# Patient Record
Sex: Male | Born: 1970 | Race: White | Hispanic: No | Marital: Married | Smoking: Former smoker
Health system: Southern US, Community
[De-identification: ages and names within clinical notes are randomized; demographics above are authoritative.]

---

## 2005-08-12 ENCOUNTER — Emergency Department: Payer: Self-pay | Admitting: Unknown Physician Specialty

## 2005-08-15 ENCOUNTER — Emergency Department: Payer: Self-pay | Admitting: Emergency Medicine

## 2005-08-19 ENCOUNTER — Emergency Department: Payer: Self-pay | Admitting: Internal Medicine

## 2005-08-26 ENCOUNTER — Emergency Department: Payer: Self-pay | Admitting: Emergency Medicine

## 2005-09-09 ENCOUNTER — Emergency Department: Payer: Self-pay | Admitting: Internal Medicine

## 2008-02-05 HISTORY — PX: APPENDECTOMY: SHX54

## 2008-11-22 ENCOUNTER — Ambulatory Visit: Payer: Self-pay | Admitting: Internal Medicine

## 2008-11-24 ENCOUNTER — Ambulatory Visit: Payer: Self-pay | Admitting: Surgery

## 2010-04-15 IMAGING — CT CT ABD-PELV W/ CM
1 of 2 series · 14 of 32 positions shown, 18 images · non-contrast
Comparison: none

REASON FOR EXAM: Disfused Abd Pain Increased White Blood Cells
COMMENTS:

[Series 2: soft tissue · axial · 0.79mm/px · z∈[-524,-88]mm · 14 of 95 slices shown, 18 images]
[im 4/95  soft-tissue]
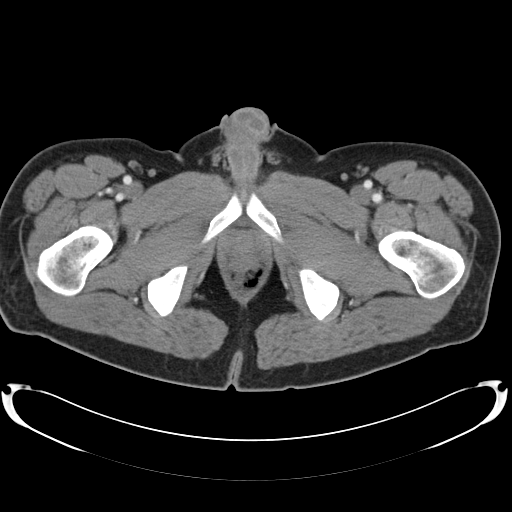
[im 4/95  bone]
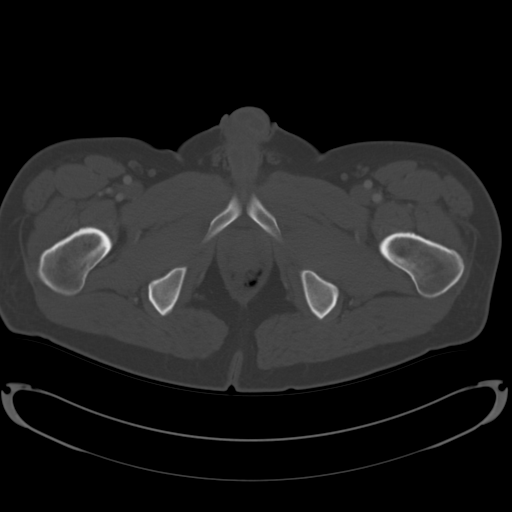
[im 12/95  soft-tissue]
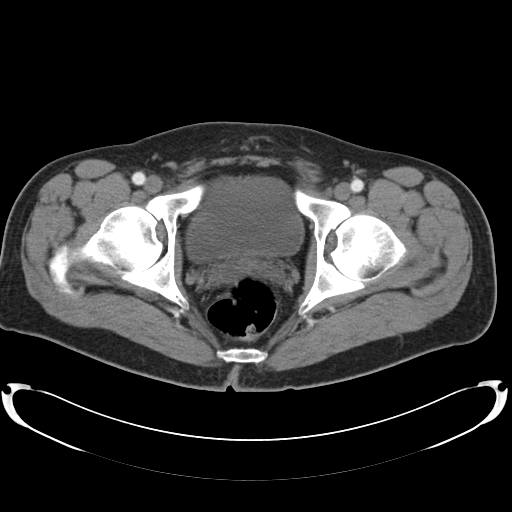
[im 20/95  soft-tissue]
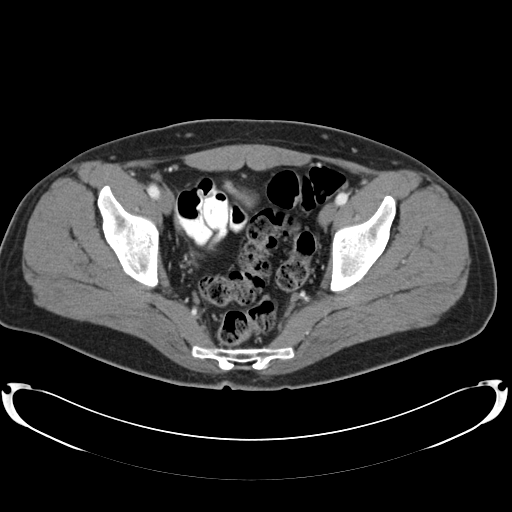
[im 28/95  soft-tissue]
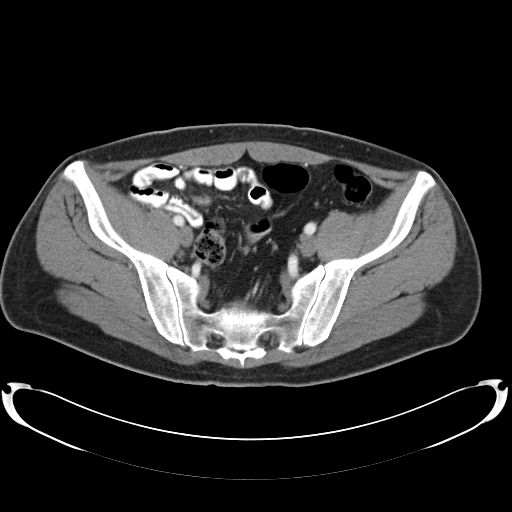
[im 36/95  soft-tissue]
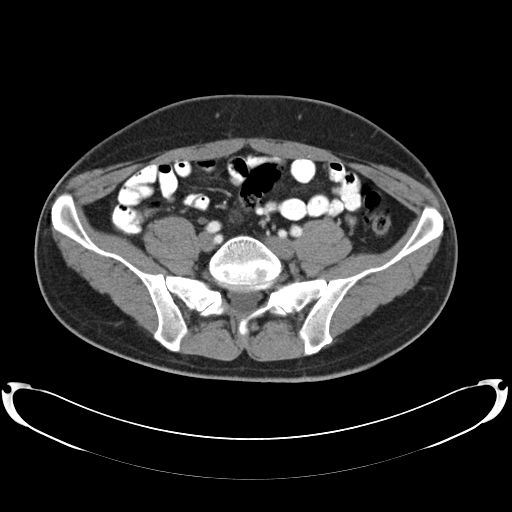
[im 44/95  soft-tissue]
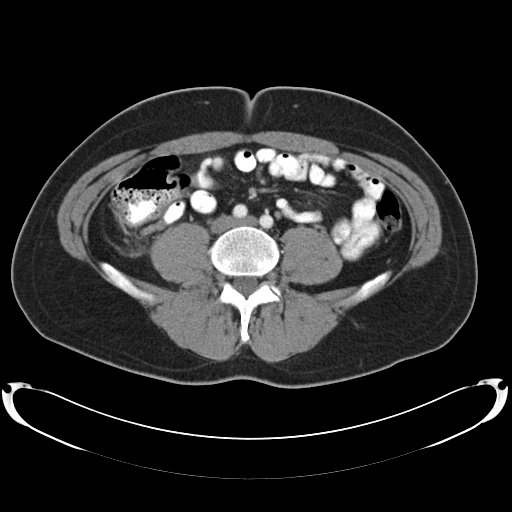
[im 51/95  soft-tissue]
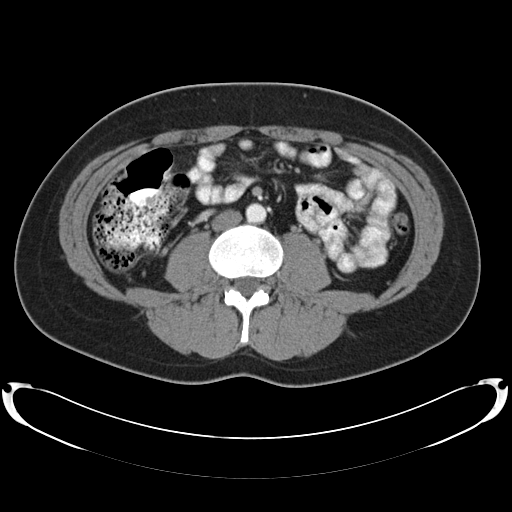
[im 59/95  soft-tissue]
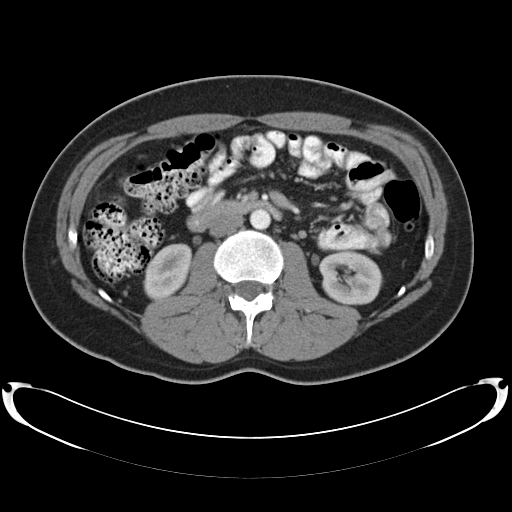
[im 67/95  soft-tissue]
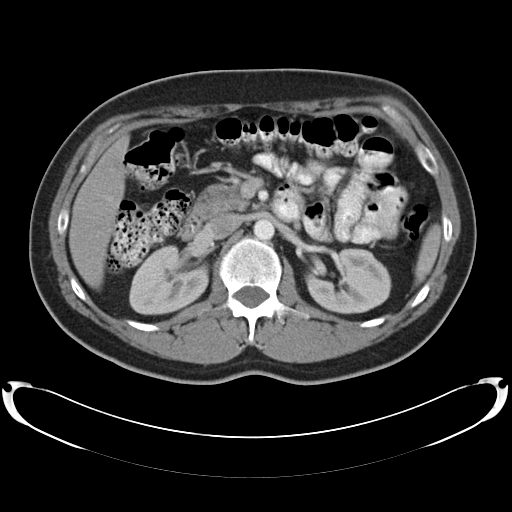
[im 67/95  bone]
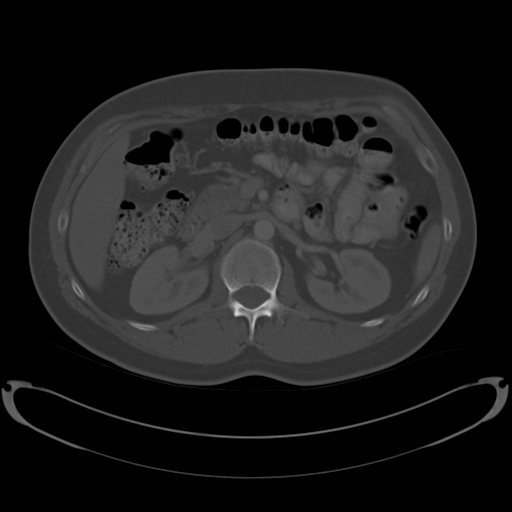
[im 75/95  soft-tissue]
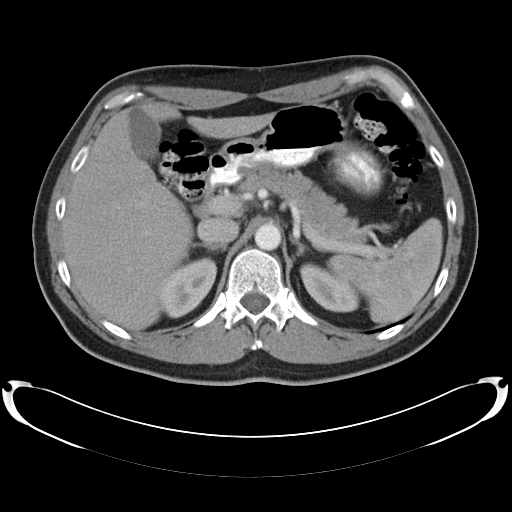
[im 79/95  lung]
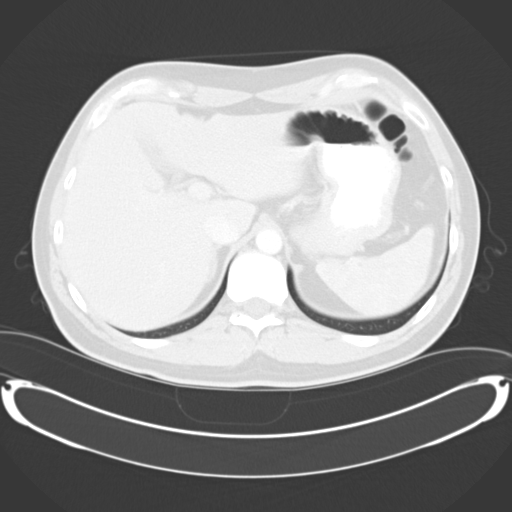
[im 83/95  soft-tissue]
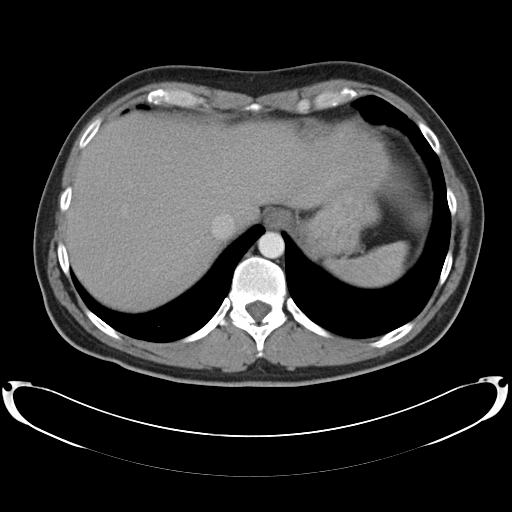
[im 83/95  lung]
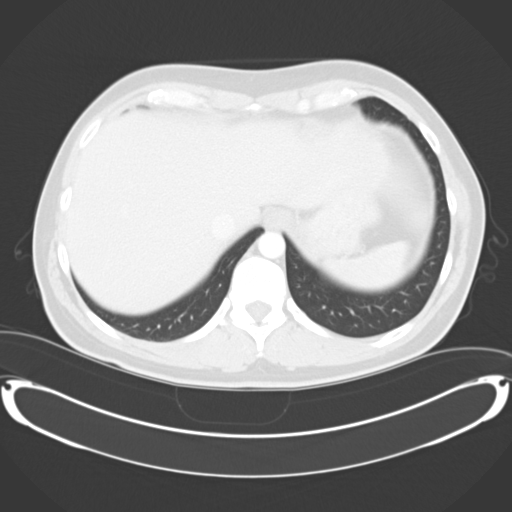
[im 87/95  lung]
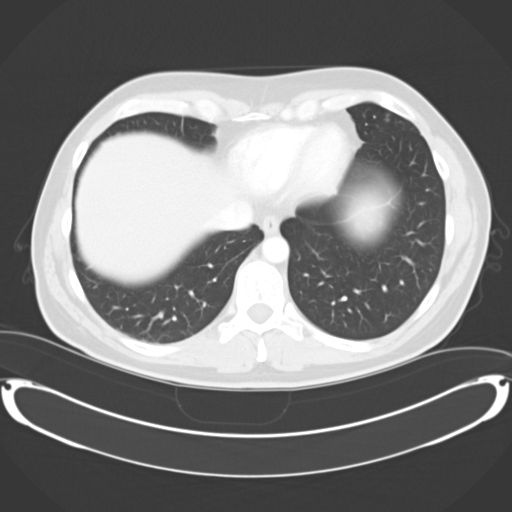
[im 91/95  soft-tissue]
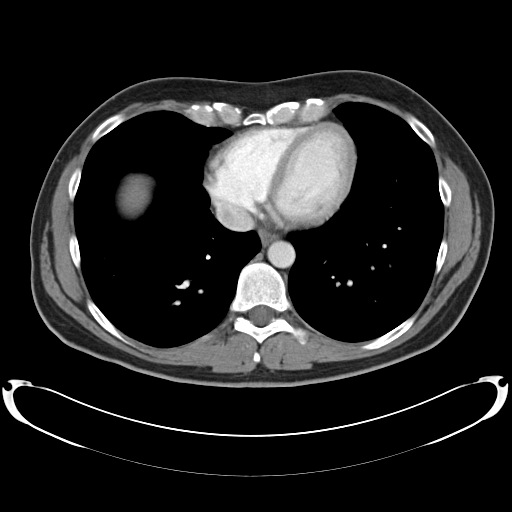
[im 91/95  lung]
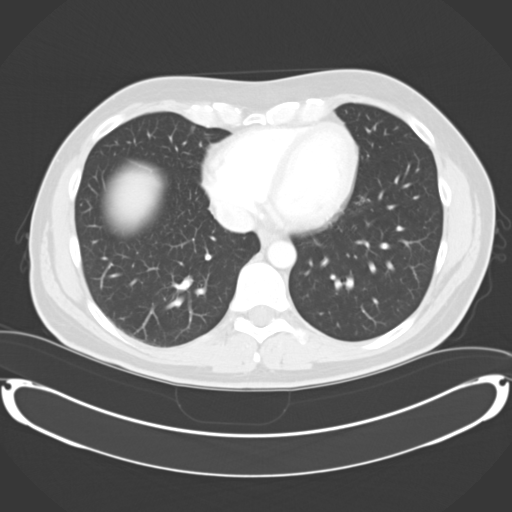

[14 of 32 positions shown; findings below may reference images not displayed]

PROCEDURE:     CT  - CT ABDOMEN / PELVIS  W  - November 22, 2008  [DATE]

RESULT:     Axial CT scanning was performed through the abdomen and pelvis
at 5 mm intervals and slice thicknesses following intravenous demonstration
of 100 cc of Isovue 370. The patient also received oral contrast material.
Review of 3-dimensional reconstructed images was performed separately on the
[employer] monitor.

The liver exhibits normal density with no focal mass nor ductal dilation.
The gallbladder is adequately distended with no evidence of calcified stones
or wall thickening or pericholecystic fluid. The pancreas, spleen, adrenal
glands, partially distended stomach, and kidneys exhibit no acute
abnormality. There is likely a subcapsular cyst measuring 1 cm in diameter
involving the posterior aspect of the spleen. The caliber of the abdominal
aorta is normal. The periaortic and pericaval regions are normal.

The partially contrast-filled loops of small and large bowel are normal in
appearance. There is mild inflammatory change in the right lower quadrant of
the abdomen associated with the appendix. It appears very mildly edematous
and there is increased density the. There is no abcess or free fluid or free
air nor bowel obstruction. There is no evidence of ascites. The prostate
gland produces a mild impression upon the urinary bladder base. The
ureterovesical junctions are demonstrated and appear normal. The seminal
vesicles and pelvic sidewalls exhibit no acute abnormality. The rectosigmoid
colon is within the limits of normal. The lung bases are clear. The lumbar
vertebral bodies are preserved in height.
IMPRESSION: 1. There are mild inflammatory changes in the periappendiceal region. The
appendix itself does not fill with contrast. No appendicolith is seen. I see
no discrete abscess however. The areas of abnormality are seen on images 50
through 58. Surgical consultation is recommended.
2. There is no evidence of urinary tract obstruction nor inflammatory change
of either kidney.
3. There is no evidence of gallstones nor other acute hepatobiliary
abnormality.

This report was called to Dr. Zeinab at approximately [DATE] p.m. on [DATE]

## 2010-04-17 IMAGING — US ABDOMEN ULTRASOUND LIMITED
1 series · 17 of 25 positions shown · non-contrast
Comparison: none

REASON FOR EXAM: [id number]  abd pain eval gallbladder
COMMENTS:

[Series 1: abdomen ultrasound limited · 17 of 57 slices shown]
[im 1/57]
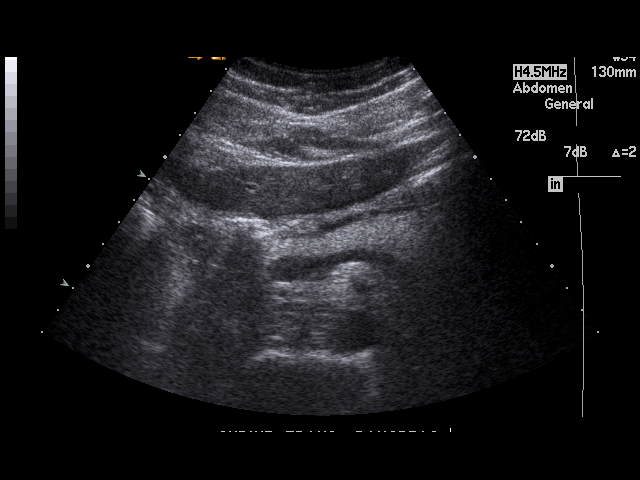
[im 5/57]
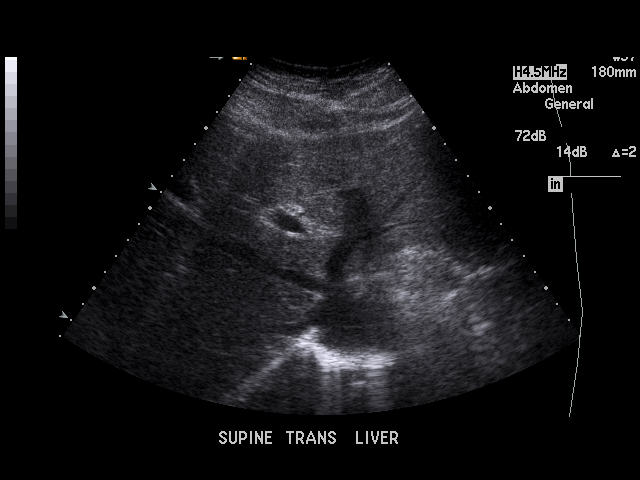
[im 8/57]
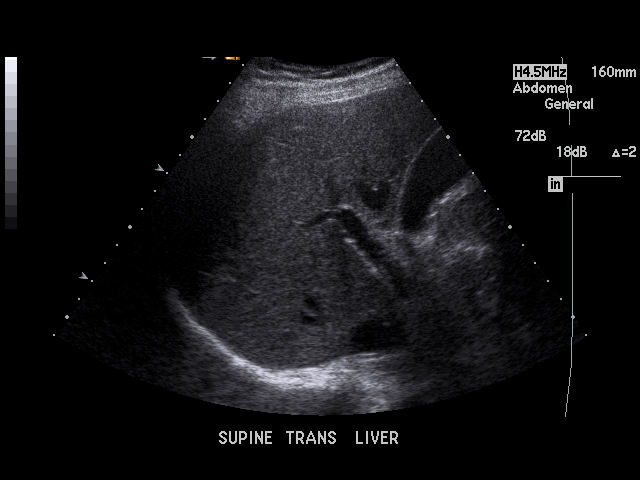
[im 12/57]
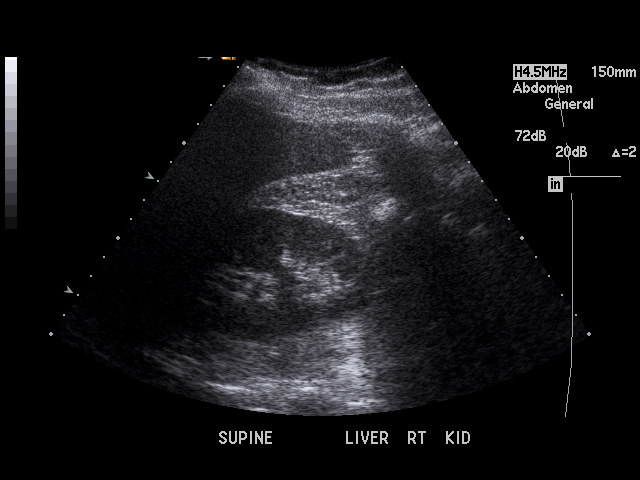
[im 15/57]
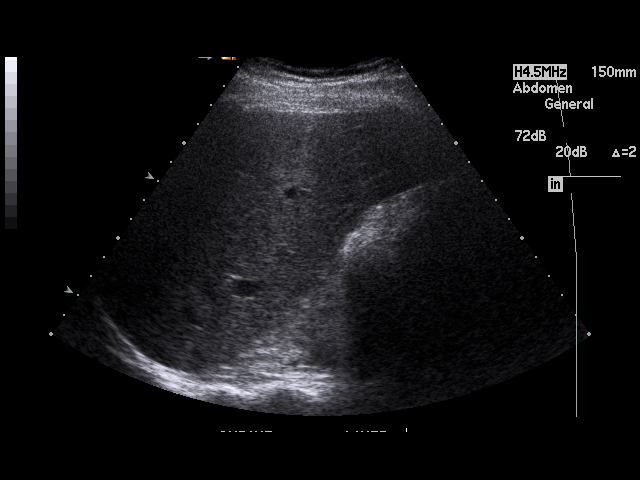
[im 19/57]
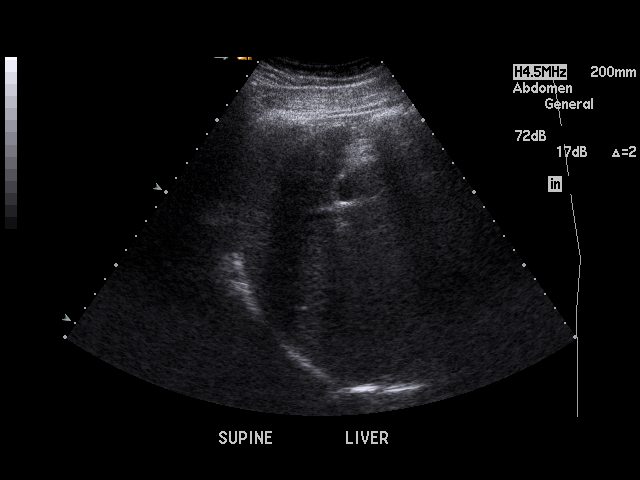
[im 22/57]
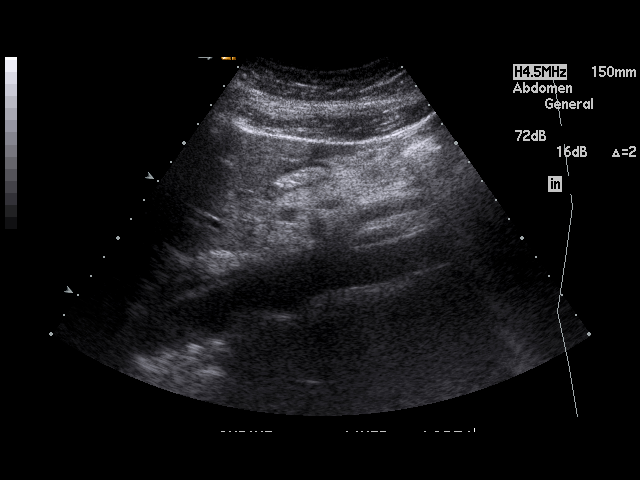
[im 26/57]
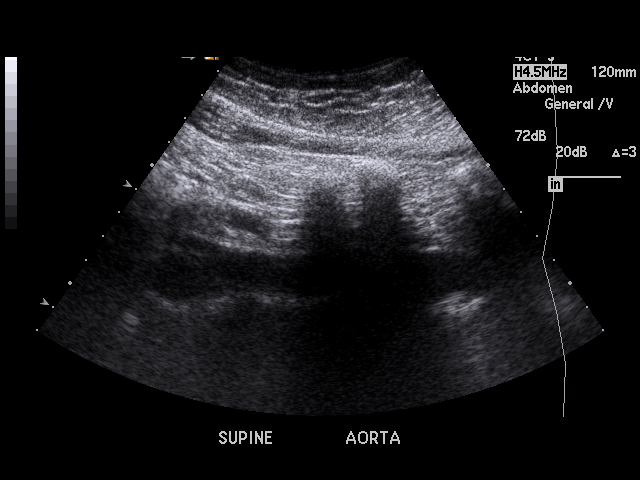
[im 29/57]
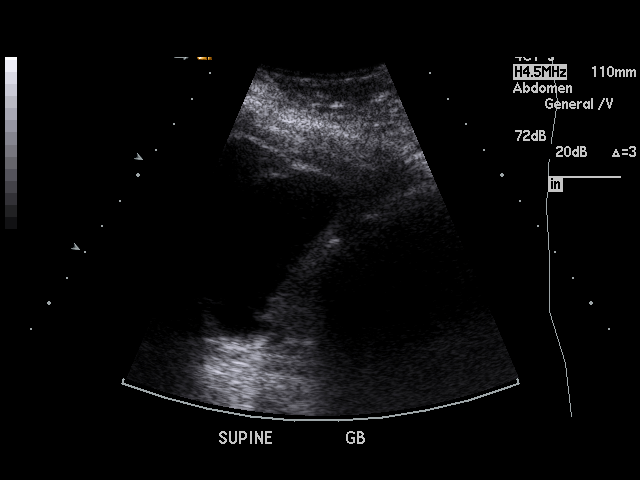
[im 31/57]
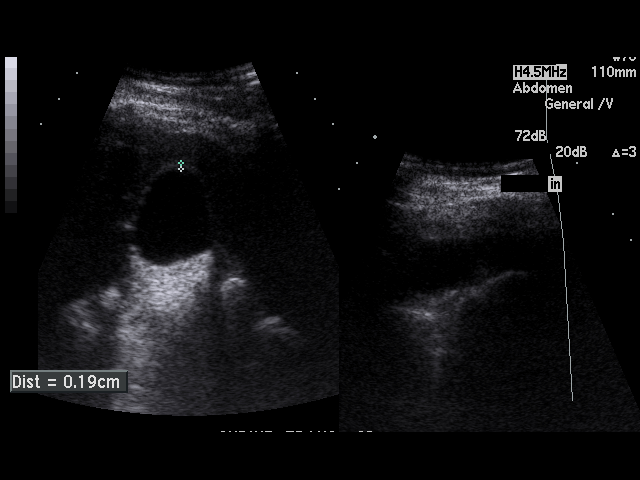
[im 36/57]
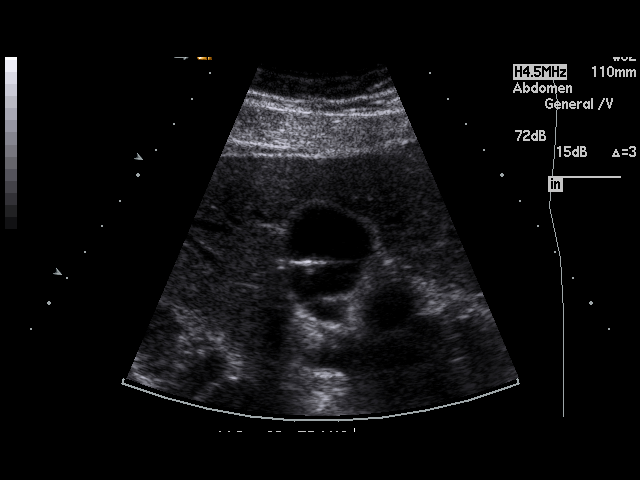
[im 38/57]
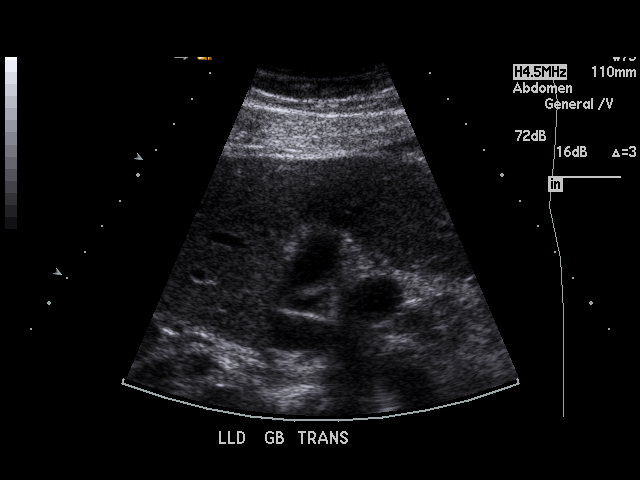
[im 43/57]
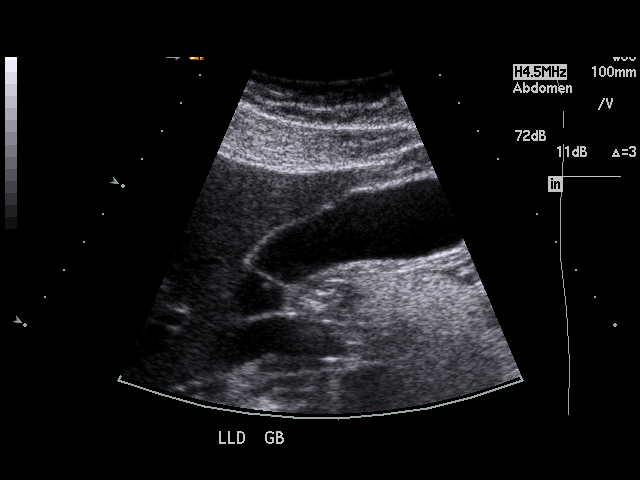
[im 45/57]
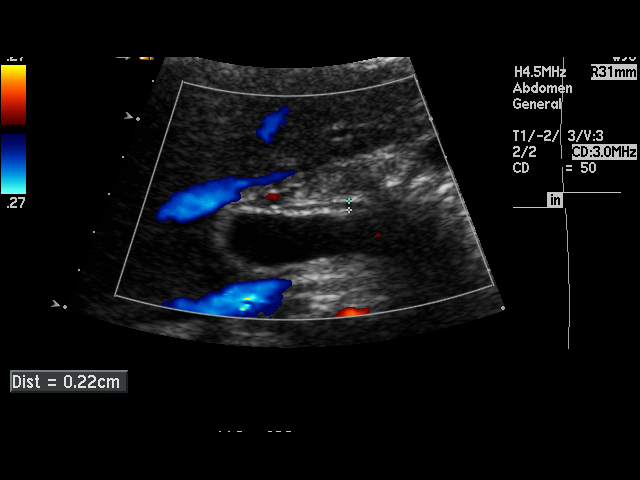
[im 50/57]
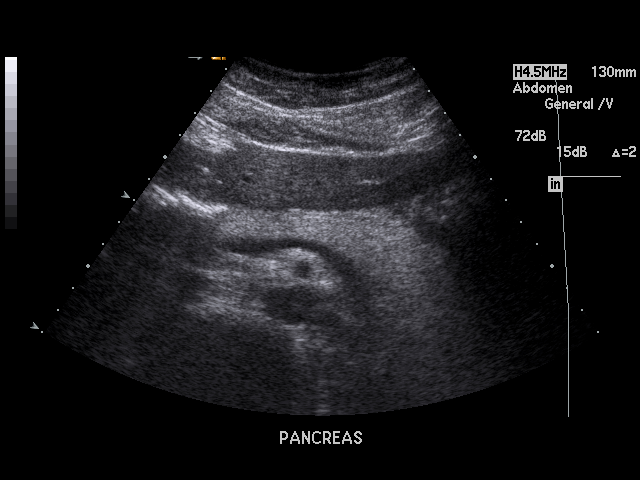
[im 52/57]
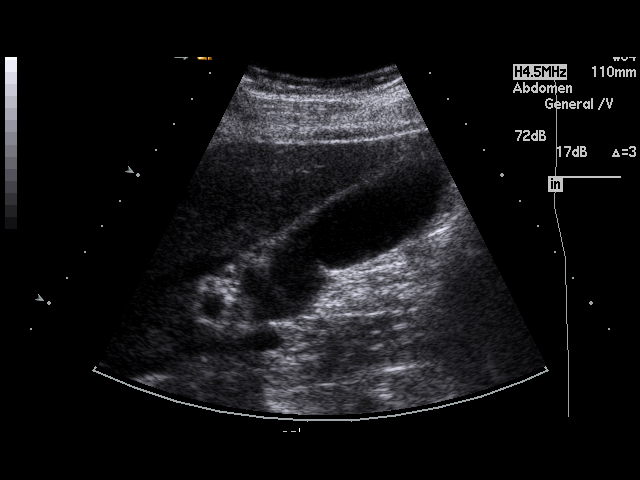
[im 57/57]
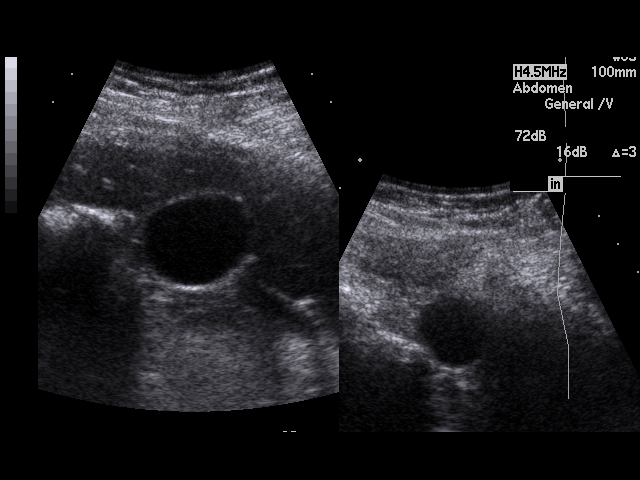

[17 of 25 positions shown; findings below may reference images not displayed]

PROCEDURE:     US  - US ABDOMEN LIMITED SURVEY  - November 24, 2008  [DATE]

RESULT:     Limited abdominal examination for evaluation of the gallbladder
was performed. No gallstones are seen. There is a tiny, nonmobile,
nonshadowing echodensity in the gallbladder consistent with a polyp that
measures 3.7 mm at maximum diameter. At least two other tiny echodensities
are seen which may represent additional polyps. No findings suspicious for a
stone are seen. There is no thickening of the gallbladder wall. The common
bile duct measures 2.2 mm in diameter which is within normal limits. The
kidneys show no hydronephrosis. Note is made that the liver and pancreas are
also visualized and are normal in appearance.
IMPRESSION: 1.  No gallstones are seen.
2.  There is observed a 3.7 mm echodensity in the gallbladder compatible
with a small polyp. There may be two additional smaller polyps but this is
not certain.
3.  The gallbladder wall measures 1.9 mm in thickness which is well within
normal limits.
4.  The common bile duct is normal in size.

## 2014-02-16 ENCOUNTER — Ambulatory Visit (INDEPENDENT_AMBULATORY_CARE_PROVIDER_SITE_OTHER): Payer: No Typology Code available for payment source | Admitting: Family Medicine

## 2014-02-16 ENCOUNTER — Other Ambulatory Visit (INDEPENDENT_AMBULATORY_CARE_PROVIDER_SITE_OTHER): Payer: No Typology Code available for payment source

## 2014-02-16 ENCOUNTER — Encounter: Payer: Self-pay | Admitting: Family Medicine

## 2014-02-16 VITALS — BP 108/70 | HR 72 | Ht 72.0 in | Wt 187.0 lb

## 2014-02-16 DIAGNOSIS — M25512 Pain in left shoulder: Secondary | ICD-10-CM

## 2014-02-16 DIAGNOSIS — M7542 Impingement syndrome of left shoulder: Secondary | ICD-10-CM

## 2014-02-16 DIAGNOSIS — M75102 Unspecified rotator cuff tear or rupture of left shoulder, not specified as traumatic: Secondary | ICD-10-CM

## 2014-02-16 NOTE — Patient Instructions (Signed)
Good to see you Ice 20 minutes 2 times daily. Usually after activity and before bed. Exercises 3 times a week.  Vitamin D 2000 IU daily Continue the turmeric  Pennsaid twice daily as needed See me again in 3 weeks.

## 2014-02-16 NOTE — Progress Notes (Signed)
Tawana ScaleZach Murphy D.O. [facility] [address] [place], [place] [address] Phone: [phone] Subjective:    I'm seeing this patient by the request  of:  Dr. Dorothey BasemanStrickland.  CC: Left shoulder pain  BJY:[id number]:Subjective Peter BumpersJeffrey B Murphy is a 44 y.o. male coming in with complaint of left shoulder pain. Patient states he has had this pain for approximate 9 months. Patient remembers doing pushups and having severe pain. Patient states since that time he unfortunately continues to have difficulty actually moving his arms certain directions and states that the pain can cause some weakness. Denies any significant radiation down the arm or any numbness. Patient states though that it seems to be localized to the shoulder and can wake him up at night especially if he lays on that side. Patient was the severity of pain a 7 out of 10. Patient is to avoid over-the-counter medications as much as possible. She is able to do daily activities without any significant difficulty.     Past medical history, social, surgical and family history all reviewed in electronic medical record.   Review of Systems: No headache, visual changes, nausea, vomiting, diarrhea, constipation, dizziness, abdominal pain, skin rash, fevers, chills, night sweats, weight loss, swollen lymph nodes, body aches, joint swelling, muscle aches, chest pain, shortness of breath, mood changes.   Objective Blood pressure 108/70, pulse 72, height 6' (1.829 m), weight 187 lb (84.823 kg), SpO2 97 %.  General: No apparent distress alert and oriented x3 mood and affect normal, dressed appropriately.  HEENT: Pupils equal, extraocular movements intact  Respiratory: Patient's speak in full sentences and does not appear short of breath  Cardiovascular: No lower extremity edema, non tender, no erythema  Skin: Warm dry intact with no signs of infection or rash on extremities or on axial skeleton.  Abdomen: Soft nontender  Neuro: Cranial nerves II through  XII are intact, neurovascularly intact in all extremities with 2+ DTRs and 2+ pulses.  Lymph: No lymphadenopathy of posterior or anterior cervical chain or axillae bilaterally.  Gait normal with good balance and coordination.  MSK:  Non tender with full range of motion and good stability and symmetric strength and tone of  elbows, wrist, hip, knee and ankles bilaterally.  Shoulder: left Inspection reveals no abnormalities, atrophy or asymmetry. Palpation is normal with no tenderness over AC joint or bicipital groove. ROM is full in all planes passively. Rotator cuff strength normal throughout. signs of impingement with positive Neer and Hawkin's tests, but negative empty can sign. Speeds and Yergason's tests normal. No labral pathology noted with negative Obrien's, negative clunk and good stability. Normal scapular function observed. No painful arc and no drop arm sign. No apprehension sign Contralateral shoulder unremarkable  MSK US performed of: left This study was ordered, performed, and interpreted by Terrilee FilesZach Murphy D.O.  Shoulder:   Supraspinatus:  Mild chronic degenerative changes with question will healing articular surface tear of approximately 30%. Bursal bulge seen with shoulder abduction on impingement view. Infraspinatus:  Appears normal on long and transverse views. Significant increase in Doppler flow Subscapularis:  Mild degenerative changes. Positive bursa Teres Minor:  Appears normal on long and transverse views. AC joint:  Mild to moderate arthritis Glenohumeral Joint:  Appears normal without effusion. Glenoid Labrum:  Intact without visualized tears. Biceps Tendon:  Appears normal on long and transverse views, no fraying of tendon, tendon located in intertubercular groove, no subluxation with shoulder internal or external rotation.  Impression: Subacromial bursitis with some chronic changes of  the rotator cuff  Procedure: Real-time Ultrasound Guided Injection of left  glenohumeral joint Device: GE Logiq E  Ultrasound guided injection is preferred based studies that show increased duration, increased effect, greater accuracy, decreased procedural pain, increased response rate with ultrasound guided versus blind injection.  Verbal informed consent obtained.  Time-out conducted.  Noted no overlying erythema, induration, or other signs of local infection.  Skin prepped in a sterile fashion.  Local anesthesia: Topical Ethyl chloride.  With sterile technique and under real time ultrasound guidance:  Joint visualized.  23g 1  inch needle inserted posterior approach. Pictures taken for needle placement. Patient did have injection of 2 cc of 1% lidocaine, 2 cc of 0.5% Marcaine, and 1.0 cc of Kenalog 40 mg/dL. Completed without difficulty  Pain immediately resolved suggesting accurate placement of the medication.  Advised to call if fevers/chills, erythema, induration, drainage, or persistent bleeding.  Images permanently stored and available for review in the ultrasound unit.  Impression: Technically successful ultrasound guided injection.     Impression and Recommendations:     This case required medical decision making of moderate complexity.

## 2014-02-16 NOTE — Assessment & Plan Note (Addendum)
Patient was given an injection today. Patient tolerated the procedure well with good resolution of pain. We discussed home exercises and icing, call. We discussed over-the-counter medications a can be beneficial in patient will try some topical anti-inflammatories. Patient and will come back again in 3 weeks for further evaluation. Patient has any increasing weakness she will come back sooner in advance imaging would likely be warranted. I believe the patient will do very well with conservative therapy.

## 2014-03-09 ENCOUNTER — Encounter: Payer: Self-pay | Admitting: Family Medicine

## 2014-03-09 ENCOUNTER — Ambulatory Visit (INDEPENDENT_AMBULATORY_CARE_PROVIDER_SITE_OTHER): Payer: No Typology Code available for payment source | Admitting: Family Medicine

## 2014-03-09 VITALS — BP 128/78 | HR 68 | Ht 72.0 in | Wt 178.0 lb

## 2014-03-09 DIAGNOSIS — M129 Arthropathy, unspecified: Secondary | ICD-10-CM

## 2014-03-09 DIAGNOSIS — M7542 Impingement syndrome of left shoulder: Secondary | ICD-10-CM

## 2014-03-09 DIAGNOSIS — M19019 Primary osteoarthritis, unspecified shoulder: Secondary | ICD-10-CM

## 2014-03-09 DIAGNOSIS — M75102 Unspecified rotator cuff tear or rupture of left shoulder, not specified as traumatic: Secondary | ICD-10-CM

## 2014-03-09 NOTE — Progress Notes (Signed)
Pre visit review using our clinic review tool, if applicable. No additional management support is needed unless otherwise documented below in the visit note. 

## 2014-03-09 NOTE — Assessment & Plan Note (Signed)
She was given phase II exercises. We discussed icing regimen at home exercises. We discussed what activities in the gym to avoid. Patient was given an exercise prescription today. Patient will try to make these different changes and come back in 3 weeks. Patient continues to have difficulty we'll consider an acromioclavicular joint injection.  Spent  25 minutes with patient face-to-face and had greater than 50% of counseling including as described above in assessment and plan.

## 2014-03-09 NOTE — Patient Instructions (Signed)
Good to see you Peter Murphy is your friend.  Would continue the exercises still 3 times a week for another 6 weeks.  OK to go to gym but decrease 50% and increase 10-15% a week.  Pennsaid as needed See me again in 3 weeks if still in pain we can try a AC injection.

## 2014-03-09 NOTE — Progress Notes (Signed)
  Tawana ScaleZach Payden Murphy D.O. [facility] [address] [place], [place] [address] Phone: [phone]([phone] Subjective:    CC: Left shoulder pain  HQI:[id number]:Subjective Peter BumpersJeffrey B Murphy is a 44 y.o. male coming in with complaint of left shoulder pain. Patient initially had an injury after doing pushups. Patient was found to have subacromial bursitis as well as some degenerative changes of the rotator cuff. Patient was given an injection, home exercises and we discussed over-the-counter medications. Patient states he is approximately 90% better. Patient still has some mild discomfort with certain motions but overall is feeling much better. Patient states that he has not been back to the gym but has been doing significant amount of the exercises. Patient continues with cardio. Patient states that he is taken the vitamins and this is been helpful as well. Denies any nighttime awakening.     Past medical history, social, surgical and family history all reviewed in electronic medical record.   Review of Systems: No headache, visual changes, nausea, vomiting, diarrhea, constipation, dizziness, abdominal pain, skin rash, fevers, chills, night sweats, weight loss, swollen lymph nodes, body aches, joint swelling, muscle aches, chest pain, shortness of breath, mood changes.   Objective Blood pressure 128/78, pulse 68, height 6' (1.829 m), weight 178 lb (80.74 kg), SpO2 98 %.  General: No apparent distress alert and oriented x3 mood and affect normal, dressed appropriately.  HEENT: Pupils equal, extraocular movements intact  Respiratory: Patient's speak in full sentences and does not appear short of breath  Cardiovascular: No lower extremity edema, non tender, no erythema  Skin: Warm dry intact with no signs of infection or rash on extremities or on axial skeleton.  Abdomen: Soft nontender  Neuro: Cranial nerves II through XII are intact, neurovascularly intact in all extremities with 2+ DTRs and 2+ pulses.  Lymph:  No lymphadenopathy of posterior or anterior cervical chain or axillae bilaterally.  Gait normal with good balance and coordination.  MSK:  Non tender with full range of motion and good stability and symmetric strength and tone of  elbows, wrist, hip, knee and ankles bilaterally.  Shoulder: left Inspection reveals no abnormalities, atrophy or asymmetry. Mild tenderness over the acromial clavicular joint ROM is full in all planes passively. Rotator cuff strength normal throughout. Mild impingement sign still left Speeds and Yergason's tests normal. No labral pathology noted with negative Obrien's, negative clunk and good stability. Normal scapular function observed. No painful arc and no drop arm sign. No apprehension sign Contralateral shoulder unremarkable      Impression and Recommendations:     This case required medical decision making of moderate complexity.

## 2014-03-31 ENCOUNTER — Ambulatory Visit (INDEPENDENT_AMBULATORY_CARE_PROVIDER_SITE_OTHER): Payer: No Typology Code available for payment source | Admitting: Family Medicine

## 2014-03-31 ENCOUNTER — Encounter: Payer: Self-pay | Admitting: Family Medicine

## 2014-03-31 VITALS — BP 120/74 | HR 67 | Ht 72.0 in | Wt 180.0 lb

## 2014-03-31 DIAGNOSIS — M7522 Bicipital tendinitis, left shoulder: Secondary | ICD-10-CM | POA: Insufficient documentation

## 2014-03-31 MED ORDER — NITROGLYCERIN 0.2 MG/HR TD PT24: MEDICATED_PATCH | TRANSDERMAL | Status: DC

## 2014-03-31 NOTE — Progress Notes (Signed)
Tawana ScaleZach Keysean Savino D.O. [facility] [address] [place], [place] [address] Phone: [phone]([phone] Subjective:    CC: Left shoulder pain  GUY:[id number]:Subjective Peter BumpersJeffrey B Murphy is a 44 y.o. male coming in with complaint of left shoulder pain. Patient initially had an injury after doing pushups. Patient was found to have subacromial bursitis as well as some degenerative changes of the rotator cuff. Patient was given an injection, home exercises and we discussed over-the-counter medications. At last follow-up patient was doing a proximal wing 90% better and elected to continue with the conservative therapy. 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 Patient was to try topical anti-inflammatories. Patient states he continues to do well. Patient though states that when he reaches across his body or holds different things is still can be uncomfortable. Patient is also noticed that when he is doing bicep curls he has some mild discomfort. This is still significantly better than what it was previously but seems to be more on the anterior aspect of his shoulder. Denies any pain that is keeping him up at night.     Past medical history, social, surgical and family history all reviewed in electronic medical record.   Review of Systems: No headache, visual changes, nausea, vomiting, diarrhea, constipation, dizziness, abdominal pain, skin rash, fevers, chills, night sweats, weight loss, swollen lymph nodes, body aches, joint swelling, muscle aches, chest pain, shortness of breath, mood changes.   Objective Blood pressure 120/74, pulse 67, height 6' (1.829 m), weight 180 lb (81.647 kg), SpO2 98 %.  General: No apparent distress alert and oriented x3 mood and affect normal, dressed appropriately.  HEENT: Pupils equal, extraocular movements intact  Respiratory: Patient's speak in full sentences and does not appear short of breath  Cardiovascular: No lower extremity edema, non  tender, no erythema  Skin: Warm dry intact with no signs of infection or rash on extremities or on axial skeleton.  Abdomen: Soft nontender  Neuro: Cranial nerves II through XII are intact, neurovascularly intact in all extremities with 2+ DTRs and 2+ pulses.  Lymph: No lymphadenopathy of posterior or anterior cervical chain or axillae bilaterally.  Gait normal with good balance and coordination.  MSK:  Non tender with full range of motion and good stability and symmetric strength and tone of  elbows, wrist, hip, knee and ankles bilaterally.  Shoulder: left Inspection reveals no abnormalities, atrophy or asymmetry. Mild tenderness over the acromial clavicular joint ROM is full in all planes passively. Rotator cuff strength normal throughout. Mild impingement sign still left the continues to improve Speeds and Yergason's tests positive which is a new problem No labral pathology noted with negative Obrien's, negative clunk and good stability. Normal scapular function observed. No painful arc and no drop arm sign. No apprehension sign Contralateral shoulder unremarkable  MSK US performed of: Left shoulder This study was ordered, performed, and interpreted by Terrilee FilesZach Cambri Plourde D.O.  Shoulder:   Supraspinatus:  Appears normal on long and transverse views, minimal bursal bulge still noted. Infraspinatus:  Appears normal on long and transverse views. Subscapularis:  Appears normal on long and transverse views. Teres Minor:  Appears normal on long and transverse views. AC joint:  Moderate osteophytic changes with capsular distention Glenohumeral Joint:  Appears normal without effusion. Glenoid Labrum:  Intact without visualized tears. Biceps Tendon:  Bicep tendon does have some mild increase in tendon sheath effusion noted  Impression: Improved subacromial bursitis with continued moderate acromioclavicular arthritis and new onset bicep tendinitis.  Impression and Recommendations:      This case required medical decision making of moderate complexity.

## 2014-03-31 NOTE — Patient Instructions (Addendum)
Good to see you Ice still can be helpful.  New exercises 3 times a week.  Compression sleeve to left arm from [employer].  Continue the vitamins Nitroglycerin Protocol   Apply 1/4 nitroglycerin patch to affected area daily.  Change position of patch within the affected area every 24 hours.  You may experience a headache during the first 1-2 weeks of using the patch, these should subside.  If you experience headaches after beginning nitroglycerin patch treatment, you may take your preferred over the counter pain reliever.  Another side effect of the nitroglycerin patch is skin irritation or rash related to patch adhesive.  Please notify our office if you develop more severe headaches or rash, and stop the patch.  Tendon healing with nitroglycerin patch may require 12 to 24 weeks depending on the extent of injury.  Men should not use if taking Viagra, Cialis, or Levitra.   Do not use if you have migraines or rosacea.   See me again 3 weeks.

## 2014-03-31 NOTE — Assessment & Plan Note (Signed)
Is having more of a bicep tendinitis. We discussed continuing his regimen but given new exercises. Patient will continue the icing. We'll certainly start patient on nitroglycerin patches to see if we can aid in healing. Patient was warned of potential side effects. We discussed avoiding other significant activities and patient will come back and see me again in 3 weeks for further evaluation and treatment.  Spent  25 minutes with patient face-to-face and had greater than 50% of counseling including as described above in assessment and plan.

## 2014-03-31 NOTE — Progress Notes (Signed)
Pre visit review using our clinic review tool, if applicable. No additional management support is needed unless otherwise documented below in the visit note. 

## 2014-04-21 ENCOUNTER — Ambulatory Visit: Payer: No Typology Code available for payment source | Admitting: Family Medicine

## 2014-04-26 ENCOUNTER — Encounter: Payer: Self-pay | Admitting: Family Medicine

## 2014-04-26 ENCOUNTER — Ambulatory Visit (INDEPENDENT_AMBULATORY_CARE_PROVIDER_SITE_OTHER): Payer: No Typology Code available for payment source | Admitting: Family Medicine

## 2014-04-26 VITALS — BP 116/80 | HR 62 | Ht 72.0 in | Wt 180.0 lb

## 2014-04-26 DIAGNOSIS — M7522 Bicipital tendinitis, left shoulder: Secondary | ICD-10-CM

## 2014-04-26 NOTE — Progress Notes (Signed)
  Tawana ScaleZach Sahira Cataldi D.O. [facility] [address] [place], [place] [address] Phone: [phone] Subjective:    CC: Left shoulder pain follow-up  OZH:[id number]:Subjective Peter BumpersJeffrey Murphy Murphy is a 44 y.o. male coming in with complaint of left shoulder pain. Patient was seen previously and was diagnosed with more of a biceps tendinitis. Patient is doing a compression sleeve and was started on nitroglycerin. Patient did have some headaches for the first week and then they seemed to resolve. Patient states that he is approximate 75% better. Still a dull aching pain if he does a lot of activity but otherwise seems to be improving. Denies any radiation down the arm or any numbness. Still not lifting. Patient though is resting comfortably at night.    Past medical history, social, surgical and family history all reviewed in electronic medical record.   Review of Systems: No headache, visual changes, nausea, vomiting, diarrhea, constipation, dizziness, abdominal pain, skin rash, fevers, chills, night sweats, weight loss, swollen lymph nodes, body aches, joint swelling, muscle aches, chest pain, shortness of breath, mood changes.   Objective Blood pressure 116/80, pulse 62, height 6' (1.829 m), weight 180 lb (81.647 kg), SpO2 98 %.  General: No apparent distress alert and oriented x3 mood and affect normal, dressed appropriately.  HEENT: Pupils equal, extraocular movements intact  Respiratory: Patient's speak in full sentences and does not appear short of breath  Cardiovascular: No lower extremity edema, non tender, no erythema  Skin: Warm dry intact with no signs of infection or rash on extremities or on axial skeleton.  Abdomen: Soft nontender  Neuro: Cranial nerves II through XII are intact, neurovascularly intact in all extremities with 2+ DTRs and 2+ pulses.  Lymph: No lymphadenopathy of posterior or anterior cervical chain or axillae bilaterally.  Gait normal with good balance and coordination.  MSK:   Non tender with full range of motion and good stability and symmetric strength and tone of  elbows, wrist, hip, knee and ankles bilaterally.  Shoulder: left Inspection reveals no abnormalities, atrophy or asymmetry. Mild tenderness over the acromial clavicular joint ROM is full in all planes passively. Rotator cuff strength normal throughout. Mild impingement sign still left the continues to improve Speeds and Yergason's tests positive still No labral pathology noted with negative Obrien's, negative clunk and good stability. Normal scapular function observed. No painful arc and no drop arm sign. No apprehension sign Contralateral shoulder unremarkable  MSK US performed of: Left shoulder This study was ordered, performed, and interpreted by Terrilee FilesZach Jasdeep Dejarnett D.O.  Shoulder:   Supraspinatus:  Appears normal on long and transverse views, minimal bursal bulge still noted. Infraspinatus:  Appears normal on long and transverse views. Subscapularis:  Appears normal on long and transverse views. Teres Minor:  Appears normal on long and transverse views. AC joint:  Moderate osteophytic changes with capsular distention Biceps Tendon:  Bicep tendon does have some mild increase in tendon sheath effusion but moderately improved from previous exam  Impression: Improving bicep tendinitis       Impression and Recommendations:     This case required medical decision making of moderate complexity.

## 2014-04-26 NOTE — Assessment & Plan Note (Signed)
Patient is making improvement. We encouraged patient to continue the home exercises in the icing pedicle. We encouraged a compression sleeve as well is a nitroglycerin patches. Patient will continue this conservative therapy and follow-up with me again in 4 weeks. If continuing have pain at that time I would consider a tendon sheath injection.

## 2014-04-26 NOTE — Progress Notes (Signed)
Pre visit review using our clinic review tool, if applicable. No additional management support is needed unless otherwise documented below in the visit note. 

## 2014-04-26 NOTE — Patient Instructions (Signed)
Good to see you You are doing great!! Continue the nitro Try to do exercises at least 3 times a week.  Ice after activity See me again in 4 weeks and hopefully perfect.

## 2014-05-24 ENCOUNTER — Encounter: Payer: Self-pay | Admitting: Family Medicine

## 2014-05-24 ENCOUNTER — Ambulatory Visit (INDEPENDENT_AMBULATORY_CARE_PROVIDER_SITE_OTHER): Payer: No Typology Code available for payment source | Admitting: Family Medicine

## 2014-05-24 VITALS — BP 106/72 | HR 73 | Ht 72.0 in | Wt 181.0 lb

## 2014-05-24 DIAGNOSIS — M7522 Bicipital tendinitis, left shoulder: Secondary | ICD-10-CM | POA: Diagnosis not present

## 2014-05-24 NOTE — Progress Notes (Signed)
Pre visit review using our clinic review tool, if applicable. No additional management support is needed unless otherwise documented below in the visit note. 

## 2014-05-24 NOTE — Progress Notes (Signed)
  Tawana ScaleZach Srinidhi Landers D.O. [facility] [address] [place], [place] [address] Phone: [phone] Subjective:    CC: Left shoulder pain follow-up  BJY:[id number]:Subjective Peter Murphy is a 44 y.o. male coming in with complaint of left shoulder pain. Patient states he is a proximal wing 90% better from his bicep tendinitis. Continuing the nitroglycerin patches without any side effects. Patient has not been doing the exercises regularly. Is doing the icing and the compression is feeling much better. Denies any nighttime awakening.    Past medical history, social, surgical and family history all reviewed in electronic medical record.   Review of Systems: No headache, visual changes, nausea, vomiting, diarrhea, constipation, dizziness, abdominal pain, skin rash, fevers, chills, night sweats, weight loss, swollen lymph nodes, body aches, joint swelling, muscle aches, chest pain, shortness of breath, mood changes.   Objective Blood pressure 106/72, pulse 73, height 6' (1.829 m), weight 181 lb (82.101 kg), SpO2 97 %.  General: No apparent distress alert and oriented x3 mood and affect normal, dressed appropriately.  HEENT: Pupils equal, extraocular movements intact  Respiratory: Patient's speak in full sentences and does not appear short of breath  Cardiovascular: No lower extremity edema, non tender, no erythema  Skin: Warm dry intact with no signs of infection or rash on extremities or on axial skeleton.  Abdomen: Soft nontender  Neuro: Cranial nerves II through XII are intact, neurovascularly intact in all extremities with 2+ DTRs and 2+ pulses.  Lymph: No lymphadenopathy of posterior or anterior cervical chain or axillae bilaterally.  Gait normal with good balance and coordination.  MSK:  Non tender with full range of motion and good stability and symmetric strength and tone of  elbows, wrist, hip, knee and ankles bilaterally.  Shoulder: left Inspection reveals no abnormalities, atrophy or  asymmetry. Mild tenderness over the acromial clavicular joint ROM is full in all planes passively. Rotator cuff strength normal throughout. Mild impingement sign still left the continues to improve unit from last exam Mild positive speed's No labral pathology noted with negative Obrien's, negative clunk and good stability. Normal scapular function observed. No painful arc and no drop arm sign. No apprehension sign Contralateral shoulder unremarkable  MSK US performed of: Left shoulder This study was ordered, performed, and interpreted by Terrilee FilesZach Keslee Harrington D.O.  Shoulder:   Supraspinatus:  Appears normal on long and transverse views, minimal bursal bulge still noted. Infraspinatus:  Appears normal on long and transverse views. Subscapularis:  Appears normal on long and transverse views. Teres Minor:  Appears normal on long and transverse views. AC joint:  Moderate osteophytic changes with capsular distention Biceps Tendon:  Bicep tendon has no effusion noted and no tearing appreciated. Increasing Doppler flow is noted still.  Impression: Nearly resolved biceps tendinitis       Impression and Recommendations:     This case required medical decision making of moderate complexity.

## 2014-05-24 NOTE — Assessment & Plan Note (Signed)
She is doing significantly better at this time. We discussed continuing the nitroglycerin for another 3 weeks and then every other day for 3 weeks. We discussed icing regimen and is as well as decreasing the amount of compression. We discussed phase II exercises and strengthening exercises as well as getting patient to start doing more regular lifting. Patient and will come back and see me again in 6 weeks for further evaluation and treatment.

## 2014-05-24 NOTE — Patient Instructions (Signed)
Good to see you Wear compression only with increase activity or lifting Start exercises again in 1-2 weeks and do them 2 times a week OK to lift soup cans and increase slowly Ice after a lot of activity Nitro daily for 3 weeks then every other day for 3 weeks.  See me again in 6 weeks.

## 2014-07-07 ENCOUNTER — Ambulatory Visit: Payer: No Typology Code available for payment source | Admitting: Family Medicine

## 2014-07-21 ENCOUNTER — Ambulatory Visit: Payer: No Typology Code available for payment source | Admitting: Family Medicine

## 2014-10-17 ENCOUNTER — Ambulatory Visit
Admission: RE | Admit: 2014-10-17 | Discharge: 2014-10-17 | Disposition: A | Discharge status: Home / Self Care | Payer: PRIVATE HEALTH INSURANCE | Source: Ambulatory Visit | Attending: Physician Assistant | Admitting: Physician Assistant

## 2014-10-17 ENCOUNTER — Other Ambulatory Visit: Payer: Self-pay | Admitting: Physician Assistant

## 2014-10-17 DIAGNOSIS — M25512 Pain in left shoulder: Secondary | ICD-10-CM | POA: Diagnosis present

## 2014-10-17 DIAGNOSIS — R52 Pain, unspecified: Secondary | ICD-10-CM

## 2016-01-03 ENCOUNTER — Other Ambulatory Visit (HOSPITAL_COMMUNITY)
Admission: RE | Admit: 2016-01-03 | Discharge: 2016-01-03 | Disposition: A | Discharge status: Home / Self Care | Payer: Managed Care, Other (non HMO) | Source: Ambulatory Visit | Attending: Medical | Admitting: Medical

## 2016-01-03 ENCOUNTER — Ambulatory Visit (INDEPENDENT_AMBULATORY_CARE_PROVIDER_SITE_OTHER): Payer: Managed Care, Other (non HMO) | Admitting: Medical

## 2016-01-03 ENCOUNTER — Encounter: Payer: Self-pay | Admitting: Medical

## 2016-01-03 VITALS — BP 102/78 | HR 78 | Temp 98.3°F | Ht 72.0 in | Wt 188.6 lb

## 2016-01-03 DIAGNOSIS — R21 Rash and other nonspecific skin eruption: Secondary | ICD-10-CM

## 2016-01-03 DIAGNOSIS — Z113 Encounter for screening for infections with a predominantly sexual mode of transmission: Secondary | ICD-10-CM | POA: Insufficient documentation

## 2016-01-03 NOTE — Patient Instructions (Addendum)
For std screening test will do lab work and urine ancillary std studies.  For your rash recommend lamisil otc apply twice a day.  I want you to schedule CPE in 10-14 days.(discussed with pt at that time would do DRE and include psa along with routine physical exam labs).

## 2016-01-03 NOTE — Progress Notes (Signed)
Subjective:    Patient ID: Peter Murphy, male    DOB: 05/04/1970, 45 y.o.   MRN: [id number]  HPI  I have reviewed pt PMH, PSH, FH, Social History and Surgical History.  States no major medical problems.  Pt has rash around his groin area for 2 weeks. He has fear of std. He states he was out with friends and got very drunk. He states no sex with anyone that he knows of but was quite intoxicated that night. He has fear of std.(States was not with his friends the entire night)  Regarding rash just states more irritation. Used tinactin but did not help much. No penis discharge. No testicle pain.  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  Also states he has some dribbling to urine at times after uses bathroom. He declines exam for that today. He wants exam for this done on physical.     Review of Systems  Constitutional: Negative for chills, fatigue and fever.  Respiratory: Negative for cough, chest tightness, shortness of breath and wheezing.   Cardiovascular: Negative for palpitations.  Gastrointestinal: Negative for abdominal pain.  Endocrine: Negative for polydipsia, polyphagia and polyuria.  Genitourinary: Negative for dysuria, flank pain, frequency and genital sores.       Sometimes after he urinates he will dribble some at end of the flow.  Musculoskeletal: Negative for back pain and gait problem.  Skin: Positive for rash.       See hpi.  Hematological: Negative for adenopathy. Does not bruise/bleed easily.  Psychiatric/Behavioral: Negative for behavioral problems and confusion.   History reviewed. No pertinent past medical history.   Social History   Social History  . Marital status: Married    Spouse name: N/A  . Number of children: N/A  . Years of education: N/A   Occupational History  . Not on file.   Social History Main Topics  . Smoking status: Former Smoker    Years: 14.00    Types: Cigarettes    Quit date:  01/02/2006  . Smokeless tobacco: Never Used  . Alcohol use 0.0 oz/week     Comment: usually rare but recent one event drank alot.  . Drug use: No  . Sexual activity: Yes   Other Topics Concern  . Not on file   Social History Narrative  . No narrative on file    Past Surgical History:  Procedure Laterality Date  . APPENDECTOMY  2010    Family History  Problem Relation Age of Onset  . Diabetes Mother     No Known Allergies  No current outpatient prescriptions on file prior to visit.   No current facility-administered medications on file prior to visit.     BP 102/78 (BP Location: Left Arm, Patient Position: Sitting, Cuff Size: Normal)   Pulse 78   Temp 98.3 F (36.8 C) (Oral)   Ht 6' (1.829 m) Comment: w/o shoes.  Wt 188 lb 9.6 oz (85.5 kg)   SpO2 99%   BMI 25.58 kg/m       Objective:   Physical Exam   General Mental Status- Alert. General Appearance- Not in acute distress.     Chest and Lung Exam Auscultation: Breath Sounds:-Normal.  Cardiovascular Auscultation:Rythm- Regular. Murmurs & Other Heart Sounds:Auscultation of the heart reveals- No Murmurs.  Abdomen Inspection:-Inspeection Normal. Palpation/Percussion:Note:No mass. Palpation and Percussion of the abdomen reveal- Non Tender, Non Distended + BS, no rebound or guarding.  Neurologic Cranial Nerve exam:- CN III-XII intact(No nystagmus), symmetric smile. Strength:- 5/5 equal and symmetric strength both upper and lower extremities.  Genital- no lesion or ulcers on penis. No vesicles seen. Mild red appearance to scrotum. No warmth or tenderness. No hernia on exam      Assessment & Plan:  For std screening test will do lab work and urine ancillary std studies.  For your rash recommend lamisil otc apply twice a day.  I want you to schedule CPE in 10-14 days.  Danai Gotto, Ramon DredgeEdward, PA-C   Pt wanted to possibly pay cash for labs as opposed to going through his insurance. Notified lab  personal to notify pt of cash price.

## 2016-01-03 NOTE — Progress Notes (Signed)
Pre visit review using our clinic review tool, if applicable. No additional management support is needed unless otherwise documented below in the visit note. 

## 2016-01-04 LAB — HIV ANTIBODY (ROUTINE TESTING W REFLEX): HIV: NONREACTIVE

## 2016-01-05 ENCOUNTER — Telehealth: Payer: Self-pay | Admitting: Medical

## 2016-01-05 LAB — URINE CYTOLOGY ANCILLARY ONLY
Chlamydia: NEGATIVE
Neisseria Gonorrhea: NEGATIVE
Trichomonas: NEGATIVE

## 2016-01-05 LAB — HSV(HERPES SIMPLEX VRS) I + II AB-IGG

## 2016-01-05 LAB — RPR

## 2016-01-05 NOTE — Telephone Encounter (Signed)
Opened to review 

## 2016-01-07 LAB — HSV 1/2 AB (IGM), IFA W/RFLX TITER
HSV 1 IGM SCREEN: NEGATIVE
HSV 2 IGM SCREEN: NEGATIVE

## 2016-01-08 LAB — URINE CYTOLOGY ANCILLARY ONLY: CANDIDA VAGINITIS: NEGATIVE

## 2016-01-09 ENCOUNTER — Encounter: Payer: Self-pay | Admitting: Medical

## 2016-01-09 NOTE — Telephone Encounter (Signed)
I did call pt today with all of his std testing and went over with him. Informed all negative.

## 2016-01-10 NOTE — Telephone Encounter (Signed)
Noted  

## 2016-01-15 ENCOUNTER — Ambulatory Visit (INDEPENDENT_AMBULATORY_CARE_PROVIDER_SITE_OTHER): Payer: Managed Care, Other (non HMO) | Admitting: Medical

## 2016-01-15 ENCOUNTER — Encounter: Payer: Self-pay | Admitting: Medical

## 2016-01-15 VITALS — BP 112/62 | HR 68 | Temp 98.1°F | Ht 72.0 in | Wt 193.2 lb

## 2016-01-15 DIAGNOSIS — Z0000 Encounter for general adult medical examination without abnormal findings: Secondary | ICD-10-CM | POA: Diagnosis not present

## 2016-01-15 DIAGNOSIS — N3943 Post-void dribbling: Secondary | ICD-10-CM | POA: Diagnosis not present

## 2016-01-15 DIAGNOSIS — Z23 Encounter for immunization: Secondary | ICD-10-CM

## 2016-01-15 LAB — COMPREHENSIVE METABOLIC PANEL
ALBUMIN: 4.8 g/dL (ref 3.5–5.2)
ALK PHOS: 36 U/L — AB (ref 39–117)
ALT: 12 U/L (ref 0–53)
AST: 12 U/L (ref 0–37)
BILIRUBIN TOTAL: 0.5 mg/dL (ref 0.2–1.2)
BUN: 14 mg/dL (ref 6–23)
CO2: 30 mEq/L (ref 19–32)
Calcium: 9.4 mg/dL (ref 8.4–10.5)
Chloride: 103 mEq/L (ref 96–112)
Creatinine, Ser: 0.86 mg/dL (ref 0.40–1.50)
GFR: 101.93 mL/min (ref >60.00)
GLUCOSE: 98 mg/dL (ref 70–99)
Potassium: 4.2 mEq/L (ref 3.5–5.1)
Sodium: 140 mEq/L (ref 135–145)
TOTAL PROTEIN: 7 g/dL (ref 6.0–8.3)

## 2016-01-15 LAB — PSA: PSA: 2.01 ng/mL (ref 0.10–4.00)

## 2016-01-15 LAB — POC URINALSYSI DIPSTICK (AUTOMATED)
Bilirubin, UA: NEGATIVE
Glucose, UA: NEGATIVE
Ketones, UA: NEGATIVE
Leukocytes, UA: NEGATIVE
NITRITE UA: NEGATIVE
PH UA: 6
PROTEIN UA: NEGATIVE
RBC UA: NEGATIVE
SPEC GRAV UA: 1.025
UROBILINOGEN UA: NEGATIVE

## 2016-01-15 LAB — CBC WITH DIFFERENTIAL/PLATELET
BASOS ABS: 0 10*3/uL (ref 0.0–0.1)
Basophils Relative: 0.5 % (ref 0.0–3.0)
EOS PCT: 0.8 % (ref 0.0–5.0)
Eosinophils Absolute: 0 10*3/uL (ref 0.0–0.7)
HCT: 41.8 % (ref 39.0–52.0)
HEMOGLOBIN: 14 g/dL (ref 13.0–17.0)
LYMPHS ABS: 1 10*3/uL (ref 0.7–4.0)
LYMPHS PCT: 19.1 % (ref 12.0–46.0)
MCHC: 33.6 g/dL (ref 30.0–36.0)
MCV: 88.5 fl (ref 78.0–100.0)
MONOS PCT: 6 % (ref 3.0–12.0)
Monocytes Absolute: 0.3 10*3/uL (ref 0.1–1.0)
NEUTROS PCT: 73.6 % (ref 43.0–77.0)
Neutro Abs: 3.8 10*3/uL (ref 1.4–7.7)
Platelets: 200 10*3/uL (ref 150.0–400.0)
RBC: 4.72 Mil/uL (ref 4.22–5.81)
RDW: 13.6 % (ref 11.5–15.5)
WBC: 5.1 10*3/uL (ref 4.0–10.5)

## 2016-01-15 LAB — LIPID PANEL
CHOL/HDL RATIO: 3
Cholesterol: 158 mg/dL (ref 0–200)
HDL: 45.6 mg/dL (ref >39.00)
LDL Cholesterol: 90 mg/dL (ref 0–99)
NONHDL: 112.28
Triglycerides: 111 mg/dL (ref 0.0–149.0)
VLDL: 22.2 mg/dL (ref 0.0–40.0)

## 2016-01-15 LAB — TSH: TSH: 1.2 u[IU]/mL (ref 0.35–4.50)

## 2016-01-15 NOTE — Patient Instructions (Addendum)
For your wellness exam will get fasting labs to include psa and ua.  Continue to get 10,000 steps a day and eat healthy diet.  Flu vaccine and tdap today.  Will follow psa to see if factor in your dribbling. May rx med to help with this in near future or refer after lab review.  Follow up date to be determined after lab review.   Preventive Care 40-64 Years, Male Preventive care refers to lifestyle choices and visits with your health care provider that can promote health and wellness. What does preventive care include?  A yearly physical exam. This is also called an annual well check.  Dental exams once or twice a year.  Routine eye exams. Ask your health care provider how often you should have your eyes checked.  Personal lifestyle choices, including:  Daily care of your teeth and gums.  Regular physical activity.  Eating a healthy diet.  Avoiding tobacco and drug use.  Limiting alcohol use.  Practicing safe sex.  Taking low-dose aspirin every day starting at age 5. What happens during an annual well check? The services and screenings done by your health care provider during your annual well check will depend on your age, overall health, lifestyle risk factors, and family history of disease. Counseling  Your health care provider may ask you questions about your:  Alcohol use.  Tobacco use.  Drug use.  Emotional well-being.  Home and relationship well-being.  Sexual activity.  Eating habits.  Work and work Statistician. Screening  You may have the following tests or measurements:  Height, weight, and BMI.  Blood pressure.  Lipid and cholesterol levels. These may be checked every 5 years, or more frequently if you are over 74 years old.  Skin check.  Lung cancer screening. You may have this screening every year starting at age 15 if you have a 30-pack-year history of smoking and currently smoke or have quit within the past 15 years.  Fecal occult  blood test (FOBT) of the stool. You may have this test every year starting at age 41.  Flexible sigmoidoscopy or colonoscopy. You may have a sigmoidoscopy every 5 years or a colonoscopy every 10 years starting at age 47.  Prostate cancer screening. Recommendations will vary depending on your family history and other risks.  Hepatitis C blood test.  Hepatitis B blood test.  Sexually transmitted disease (STD) testing.  Diabetes screening. This is done by checking your blood sugar (glucose) after you have not eaten for a while (fasting). You may have this done every 1-3 years. Discuss your test results, treatment options, and if necessary, the need for more tests with your health care provider. Vaccines  Your health care provider may recommend certain vaccines, such as:  Influenza vaccine. This is recommended every year.  Tetanus, diphtheria, and acellular pertussis (Tdap, Td) vaccine. You may need a Td booster every 10 years.  Varicella vaccine. You may need this if you have not been vaccinated.  Zoster vaccine. You may need this after age 64.  Measles, mumps, and rubella (MMR) vaccine. You may need at least one dose of MMR if you were born in 1957 or later. You may also need a second dose.  Pneumococcal 13-valent conjugate (PCV13) vaccine. You may need this if you have certain conditions and have not been vaccinated.  Pneumococcal polysaccharide (PPSV23) vaccine. You may need one or two doses if you smoke cigarettes or if you have certain conditions.  Meningococcal vaccine. You may need this  if you have certain conditions.  Hepatitis A vaccine. You may need this if you have certain conditions or if you travel or work in places where you may be exposed to hepatitis A.  Hepatitis B vaccine. You may need this if you have certain conditions or if you travel or work in places where you may be exposed to hepatitis B.  Haemophilus influenzae type b (Hib) vaccine. You may need this if you  have certain risk factors. Talk to your health care provider about which screenings and vaccines you need and how often you need them. This information is not intended to replace advice given to you by your health care provider. Make sure you discuss any questions you have with your health care provider. Document Released: 02/17/2015 Document Revised: 10/11/2015 Document Reviewed: 11/22/2014 Elsevier Interactive Patient Education  2017 [employer].

## 2016-01-15 NOTE — Progress Notes (Signed)
Subjective:    Patient ID: Peter Murphy, male    DOB: 09/23/1970, 45 y.o.   MRN: [id number]  HPI  I have reviewed pt PMH, PSH, FH, Social History and Surgical History.  States no major medical problems.  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  Pt is fasting today.  Pt will get tetanus/tdap today  and flu vaccine today.    History reviewed. No pertinent past medical history.   Social History   Social History  . Marital status: Married    Spouse name: N/A  . Number of children: N/A  . Years of education: N/A   Occupational History  . Not on file.   Social History Main Topics  . Smoking status: Former Smoker    Years: 14.00    Types: Cigarettes    Quit date: 01/02/2006  . Smokeless tobacco: Never Used  . Alcohol use 0.0 oz/week     Comment: usually rare but recent one event drank alot.  . Drug use: No  . Sexual activity: Yes   Other Topics Concern  . Not on file   Social History Narrative  . No narrative on file    Past Surgical History:  Procedure Laterality Date  . APPENDECTOMY  2010    Family History  Problem Relation Age of Onset  . Diabetes Mother     No Known Allergies  No current outpatient prescriptions on file prior to visit.   No current facility-administered medications on file prior to visit.     BP 112/62 (BP Location: Left Arm, Patient Position: Sitting, Cuff Size: Normal)   Pulse 68   Temp 98.1 F (36.7 C) (Oral)   Ht 6' (1.829 m)   Wt 193 lb 3.2 oz (87.6 kg)   SpO2 100%   BMI 26.20 kg/m    Review of Systems  Constitutional: Negative for chills, fatigue and fever.  HENT: Negative for congestion, dental problem, hearing loss, postnasal drip, rhinorrhea, sinus pain, sinus pressure, sneezing and trouble swallowing.   Gastrointestinal: Negative for abdominal pain.  Genitourinary: Negative for decreased urine volume, difficulty urinating, discharge, dysuria, frequency,  hematuria, penile pain, penile swelling, scrotal swelling and testicular pain.       Pt states after using bathroom/urinating will dribble after the flow. Has been for about 6 months.  Pt thought moistness may have made slight rash. See last note. Since drying better rash on scrotum is less.  Pt did also use lamisil cream for about 6 days. Then stopped.  Musculoskeletal: Negative for back pain.  Skin: Negative for rash.  Neurological: Negative for dizziness, seizures, syncope, weakness, numbness and headaches.  Hematological: Negative for adenopathy. Does not bruise/bleed easily.  Psychiatric/Behavioral: Negative for agitation, behavioral problems, confusion, decreased concentration, dysphoric mood and suicidal ideas. The patient is not nervous/anxious.    History reviewed. No pertinent past medical history.   Social History   Social History  . Marital status: Married    Spouse name: N/A  . Number of children: N/A  . Years of education: N/A   Occupational History  . Not on file.   Social History Main Topics  . Smoking status: Former Smoker    Years: 14.00    Types: Cigarettes    Quit date: 01/02/2006  . Smokeless tobacco: Never Used  . Alcohol use 0.0 oz/week     Comment: usually rare but recent one event drank alot.  . Drug use: No  .  Sexual activity: Yes   Other Topics Concern  . Not on file   Social History Narrative  . No narrative on file    Past Surgical History:  Procedure Laterality Date  . APPENDECTOMY  2010    Family History  Problem Relation Age of Onset  . Diabetes Mother     No Known Allergies  No current outpatient prescriptions on file prior to visit.   No current facility-administered medications on file prior to visit.     BP 112/62 (BP Location: Left Arm, Patient Position: Sitting, Cuff Size: Normal)   Pulse 68   Temp 98.1 F (36.7 C) (Oral)   Ht 6' (1.829 m)   Wt 193 lb 3.2 oz (87.6 kg)   SpO2 100%   BMI 26.20 kg/m         Objective:   Physical Exam  General Mental Status- Alert. General Appearance- Not in acute distress.   Skin General: Color- Normal Color. Moisture- Normal Moisture.  Neck Carotid Arteries- Normal color. Moisture- Normal Moisture. No carotid bruits. No JVD.  Chest and Lung Exam Auscultation: Breath Sounds:-Normal.  Cardiovascular Auscultation:Rythm- Regular. Murmurs & Other Heart Sounds:Auscultation of the heart reveals- No Murmurs.  Abdomen Inspection:-Inspeection Normal. Palpation/Percussion:Note:No mass. Palpation and Percussion of the abdomen reveal- Non Tender, Non Distended + BS, no rebound or guarding.   Neurologic Cranial Nerve exam:- CN III-XII intact(No nystagmus), symmetric smile. Strength:- 5/5 equal and symmetric strength both upper and lower extremities.   Male Genitourinary Urethra:- No discharge. Penis- Circumcised. Scrotum- No masses. Testes- Bilateral-Normal. No hernias felt on exam. No rash on scrotum or penis.  Rectal Anorectal Exam: Performed- Normal sphincter tone. No masses noted. Prostate smooth but  Mild/minimally  enlarged on exam. Stool HEME Negative.          Assessment & Plan:  For your wellness exam will get fasting labs to include psa and ua.  Continue to get 10,000 steps a day and eat healthy diet.  Flu vaccine and tdap today.  Will follow psa to see if factor in your dribbling. May rx med to help with this in near future or refer after lab review.  Follow up date to be determined after lab review.  Mahogany Torrance, Ramon DredgeEdward, PA-C

## 2016-02-13 ENCOUNTER — Encounter: Payer: Self-pay | Admitting: Medical

## 2016-02-13 DIAGNOSIS — N4889 Other specified disorders of penis: Secondary | ICD-10-CM

## 2016-02-13 DIAGNOSIS — N3943 Post-void dribbling: Secondary | ICD-10-CM

## 2016-02-13 DIAGNOSIS — R300 Dysuria: Secondary | ICD-10-CM

## 2016-02-13 NOTE — Telephone Encounter (Signed)
Referral to urologist made. Will you work on referral.

## 2016-03-05 ENCOUNTER — Encounter: Payer: Self-pay | Admitting: Medical

## 2016-03-05 MED ORDER — DOXYCYCLINE HYCLATE 100 MG PO TABS: 100.0000 mg | ORAL_TABLET | Freq: Two times a day (BID) | ORAL | 0 refills | Status: DC

## 2016-03-05 NOTE — Telephone Encounter (Signed)
rx doxycycline sent to pharmacy

## 2016-03-09 IMAGING — CR DG SHOULDER 2+V*L*
1 series · 3 of 3 positions shown · non-contrast
Comparison: None.

CLINICAL DATA: Left shoulder pain.  Initial evaluation.

EXAM:
LEFT SHOULDER - 2+ VIEW

[Series 1: dg shoulder left · 0.14mm/px · 3 of 3 slices shown]
[im 1/3]
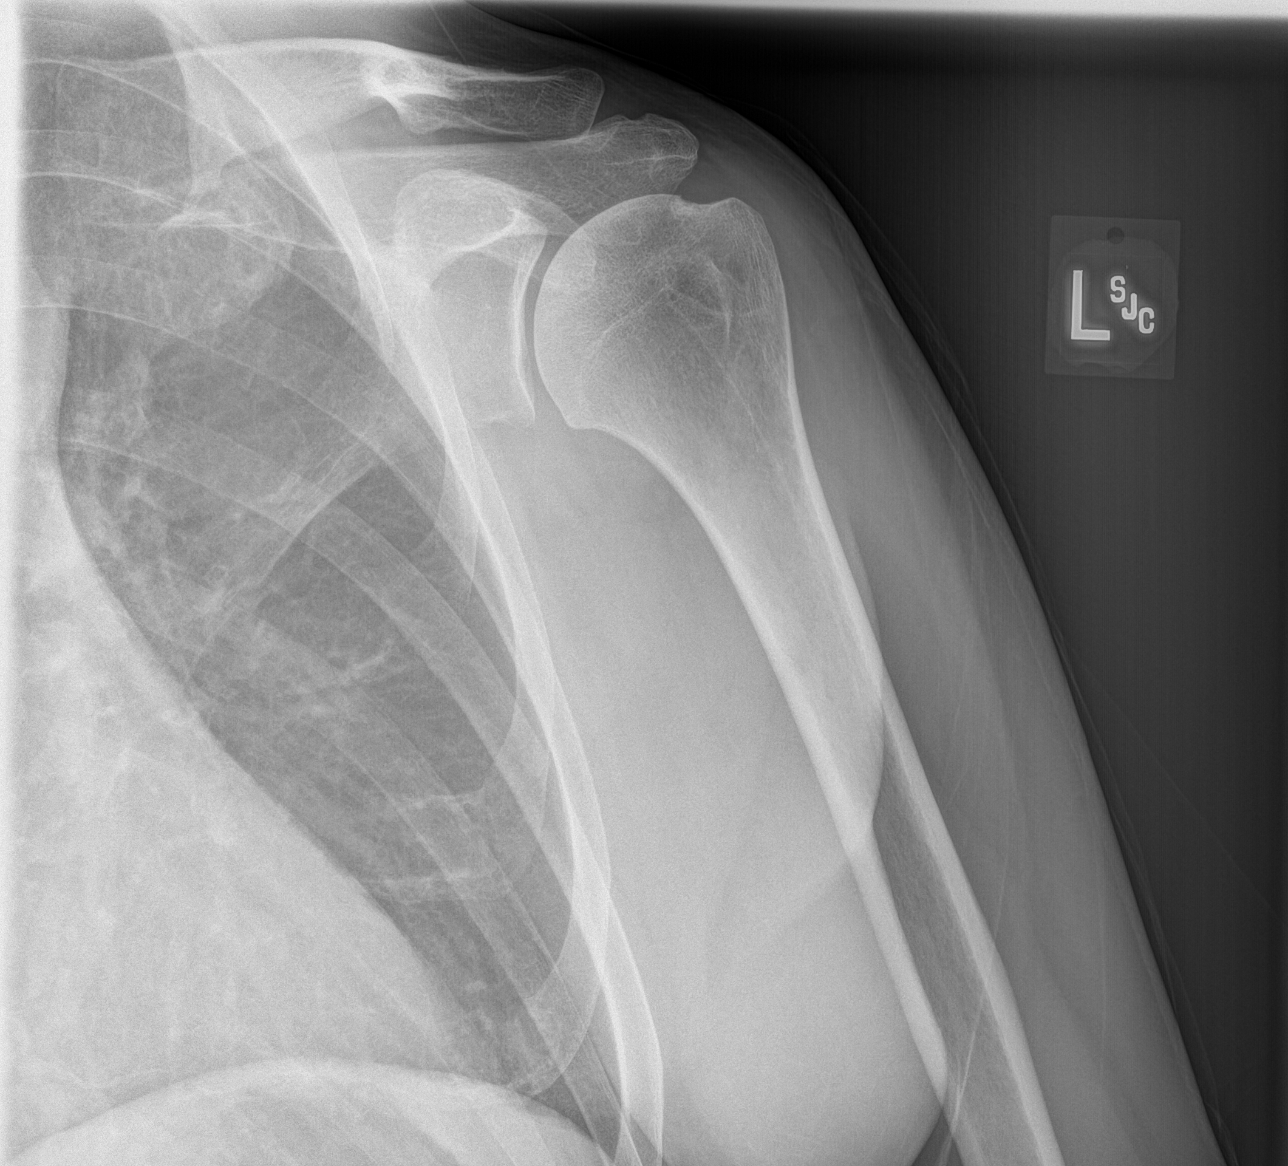
[im 2/3]
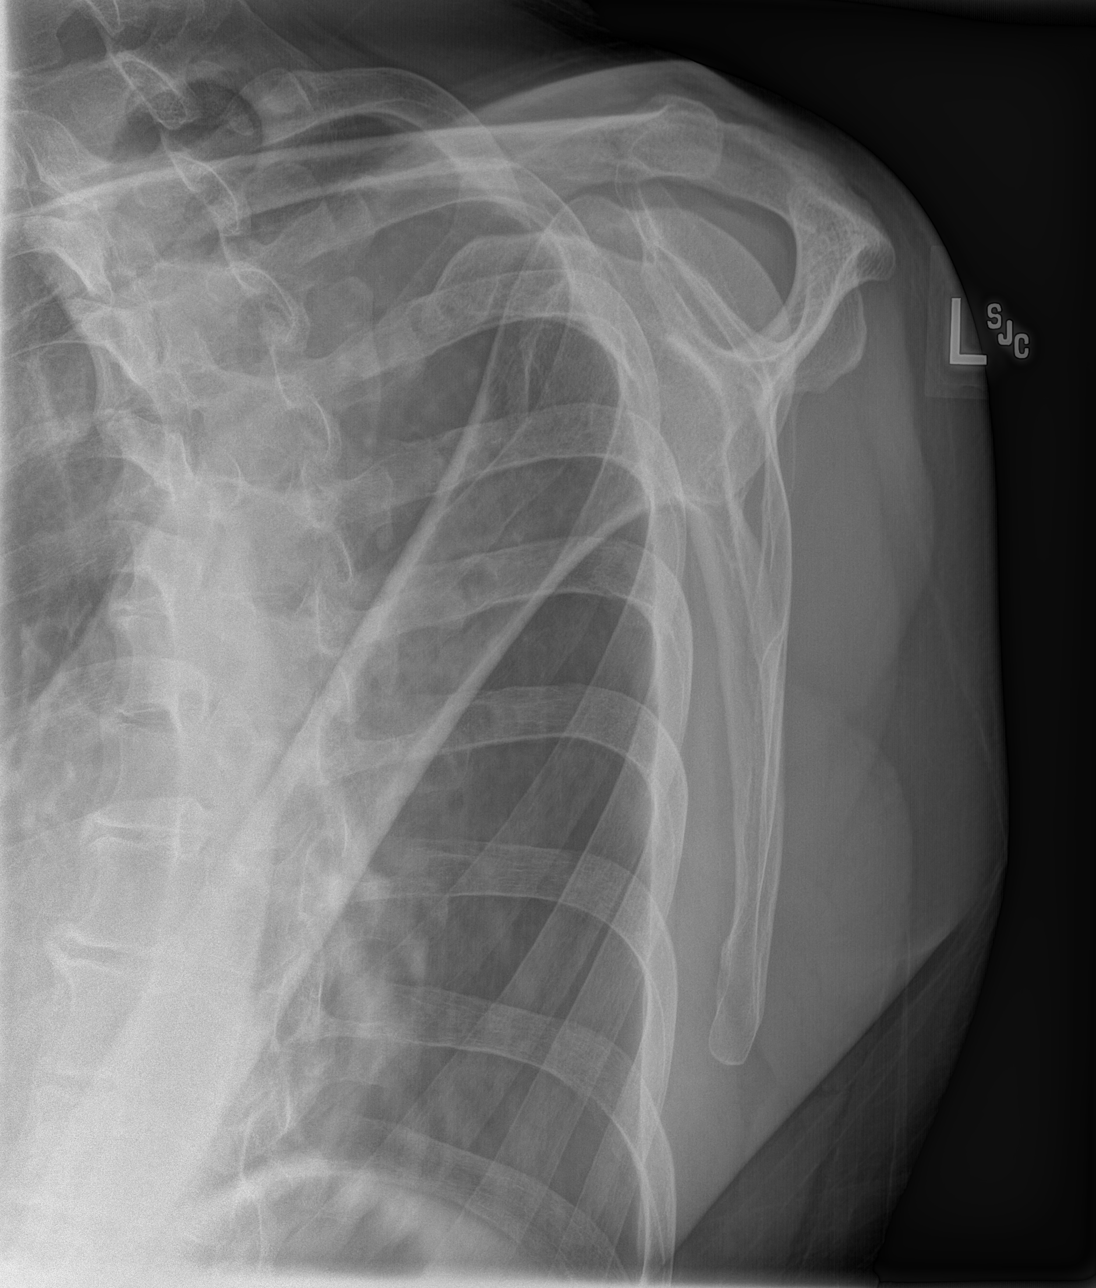
[im 3/3]
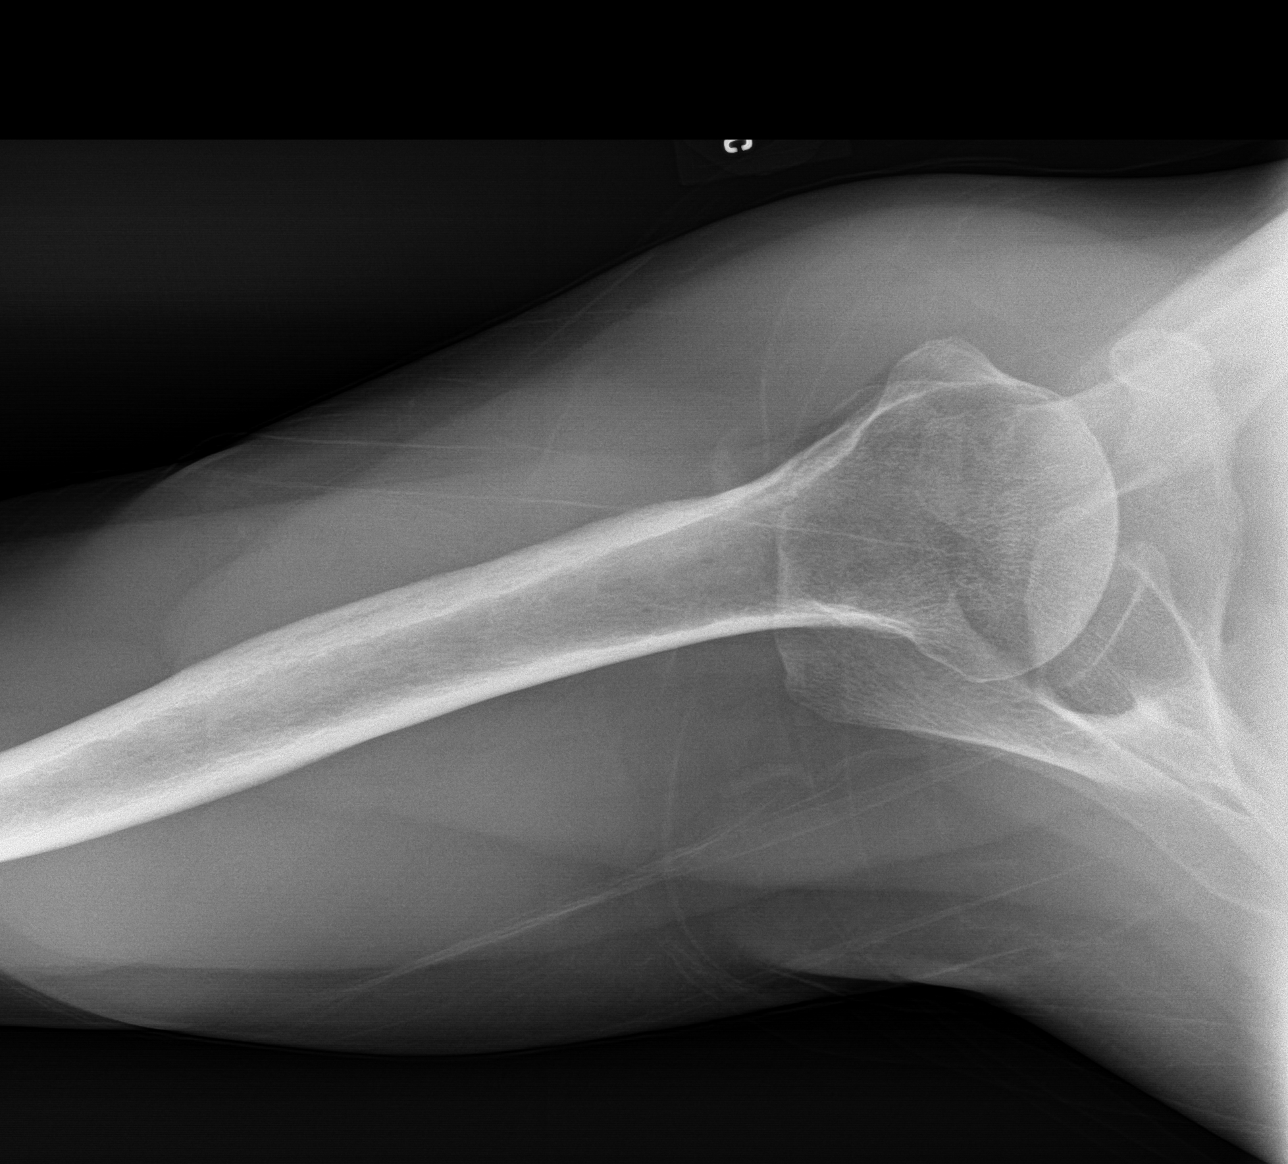

[3 of 3 positions shown; findings below may reference images not displayed]

FINDINGS: No acute bony or joint abnormality identified. No evidence of
fracture or dislocation.
IMPRESSION: No acute abnormality.

## 2016-04-24 ENCOUNTER — Emergency Department
Admission: EM | Admit: 2016-04-24 | Discharge: 2016-04-24 | Disposition: A | Discharge status: Home / Self Care | Payer: Managed Care, Other (non HMO) | Attending: Emergency Medicine | Admitting: Emergency Medicine

## 2016-04-24 DIAGNOSIS — Z87891 Personal history of nicotine dependence: Secondary | ICD-10-CM | POA: Diagnosis not present

## 2016-04-24 DIAGNOSIS — Z79899 Other long term (current) drug therapy: Secondary | ICD-10-CM | POA: Diagnosis not present

## 2016-04-24 DIAGNOSIS — T7840XA Allergy, unspecified, initial encounter: Secondary | ICD-10-CM | POA: Insufficient documentation

## 2016-04-24 MED ORDER — METHYLPREDNISOLONE SODIUM SUCC 125 MG IJ SOLR
125.0000 mg | Freq: Once | INTRAMUSCULAR | Status: AC
  Administered 2016-04-24: 125 mg via INTRAMUSCULAR
  Filled 2016-04-24: qty 2

## 2016-04-24 MED ORDER — HYDROXYZINE HCL 50 MG PO TABS: 50.0000 mg | ORAL_TABLET | Freq: Three times a day (TID) | ORAL | 0 refills | Status: DC | PRN

## 2016-04-24 MED ORDER — METHYLPREDNISOLONE 4 MG PO TBPK: ORAL_TABLET | ORAL | 0 refills | Status: DC

## 2016-04-24 MED ORDER — HYDROXYZINE HCL 50 MG PO TABS
50.0000 mg | ORAL_TABLET | Freq: Once | ORAL | Status: AC
  Administered 2016-04-24: 50 mg via ORAL
  Filled 2016-04-24: qty 1

## 2016-04-24 NOTE — ED Provider Notes (Signed)
[facility] Emergency Department Provider Note   ____________________________________________   None    (approximate)  I have reviewed the triage vital signs and the nursing notes.   HISTORY  Chief Complaint Allergic Reaction   HPI Peter Murphy is a 46 y.o. male who presents to the ED with complaints of allergic reaction. He has noticed a few days of itchy urticarial rash to his trunk, neck, and all 4 extremities. He recently restarted Bactrim for prostatitis about a week ago, but he had no problems with that medicine in the past. He denied any new soaps, detergents, or fabrics. He tried 50 mg of Benadryl PTA, but that has not provided him any relief. He denied any fevers, chills, SOB, or wheezing.   No past medical history on file.  Patient Active Problem List   Diagnosis Date Noted  . Biceps tendinitis on left 03/31/2014  . Acromioclavicular joint arthritis 03/09/2014  . Rotator cuff impingement syndrome of left shoulder 02/16/2014    Past Surgical History:  Procedure Laterality Date  . APPENDECTOMY  2010    Prior to Admission medications   Medication Sig Start Date End Date Taking? Authorizing Provider  phenazopyridine (PYRIDIUM) 200 MG tablet Take 200 mg by mouth 3 (three) times daily as needed for pain.   Yes Historical Provider, MD  QUEtiapine (SEROQUEL) 100 MG tablet Take 150 mg by mouth at bedtime.   Yes Historical Provider, MD  sulfamethoxazole-trimethoprim (BACTRIM DS,SEPTRA DS) 800-160 MG tablet Take 1 tablet by mouth 2 (two) times daily.   Yes Historical Provider, MD  tamsulosin (FLOMAX) 0.4 MG CAPS capsule Take 0.4 mg by mouth.   Yes Historical Provider, MD  doxycycline (VIBRA-TABS) 100 MG tablet Take 1 tablet (100 mg total) by mouth 2 (two) times daily. Can give caps or generic 03/05/16   Esperanza RichtersEdward Saguier, PA-C  hydrOXYzine (ATARAX/VISTARIL) 50 MG tablet Take 1 tablet (50 mg total) by mouth 3 (three) times daily as needed for itching.  04/24/16   Joni Reiningonald K Smith, PA-C  methylPREDNISolone (MEDROL DOSEPAK) 4 MG TBPK tablet Take Tapered dose as directed 04/24/16   Joni Reiningonald K Smith, PA-C    Allergies Patient has no known allergies.  Family History  Problem Relation Age of Onset  . Diabetes Mother     Social History Social History  Substance Use Topics  . Smoking status: Former Smoker    Years: 14.00    Types: Cigarettes    Quit date: 01/02/2006  . Smokeless tobacco: Never Used  . Alcohol use 0.0 oz/week     Comment: usually rare but recent one event drank alot.    Review of Systems Constitutional: No fever/chills Eyes: No visual changes. ENT: No sore throat. Cardiovascular: Denies chest pain. Respiratory: Denies shortness of breath. Gastrointestinal: No abdominal pain.  No nausea, no vomiting.  No diarrhea.  No constipation. Genitourinary: Negative for dysuria. Musculoskeletal: Negative for back pain. Skin: Positive for rash. Neurological: Negative for headaches, focal weakness or numbness. 10-point ROS otherwise negative.  ____________________________________________   PHYSICAL EXAM:  VITAL SIGNS: ED Triage Vitals [04/24/16 0633]  Enc Vitals Group     BP 120/67     Pulse Rate 100     Resp 18     Temp 97.8 F (36.6 C)     Temp Source Oral     SpO2 95 %     Weight 210 lb (95.3 kg)     Height 6' (1.829 m)     Head Circumference  Peak Flow      Pain Score      Pain Loc      Pain Edu?      Excl. in GC?     Constitutional: Alert and oriented. Well appearing and in no acute distress. Eyes: Conjunctivae are normal. PERRL. EOMI. Head: Atraumatic. Nose: No congestion/rhinnorhea. Mouth/Throat: Mucous membranes are moist.  Oropharynx non-erythematous. Neck: No stridor.   Hematological/Lymphatic/Immunilogical: No cervical lymphadenopathy. Cardiovascular: Normal rate, regular rhythm. Grossly normal heart sounds.  Good peripheral circulation. Respiratory: Normal respiratory effort.  No  retractions. Lungs CTAB. Gastrointestinal: Soft and nontender. No distention. No abdominal bruits. No CVA tenderness. Musculoskeletal: No lower extremity tenderness nor edema.  No joint effusions. Neurologic:  Normal speech and language. No gross focal neurologic deficits are appreciated. No gait instability. Skin:  Urticarial rash noted to the neck, trunk, and all four extremities, no painful Psychiatric: Mood and affect are normal. Speech and behavior are normal.  ____________________________________________   LABS (all labs ordered are listed, but only abnormal results are displayed)  Labs Reviewed - No data to display ____________________________________________  EKG ____________________________________________  RADIOLOGY  ____________________________________________   PROCEDURES  Procedure(s) performed: None  Procedures  Critical Care performed: No  ____________________________________________   INITIAL IMPRESSION / ASSESSMENT AND PLAN / ED COURSE  Pertinent labs & imaging results that were available during my care of the patient were reviewed by me and considered in my medical decision making (see chart for details).  Drug reaction without anaphylaxis or angioedema. Patient advised to discontinue Bactrim at this time. Patient advised follow-up with prescribing doctor altered medical therapy. Patient given a prescription for Motrin dosepak and Atarax. Return by the ED if condition worsens.    ____________________________________________   FINAL CLINICAL IMPRESSION(S) / ED DIAGNOSES  Final diagnoses:  Allergic reaction to drug, initial encounter      NEW MEDICATIONS STARTED DURING THIS VISIT:  New Prescriptions   HYDROXYZINE (ATARAX/VISTARIL) 50 MG TABLET    Take 1 tablet (50 mg total) by mouth 3 (three) times daily as needed for itching.   METHYLPREDNISOLONE (MEDROL DOSEPAK) 4 MG TBPK TABLET    Take Tapered dose as directed     Note:  This document  was prepared using Dragon voice recognition software and may include unintentional dictation errors.    Joni Reining, PA-C 04/24/16 4098    Emily Filbert, MD 04/24/16 [phone]

## 2016-04-24 NOTE — ED Triage Notes (Signed)
Patient ambulatory to triage with steady gait, without difficulty or distress noted; pt reports since yesterday having generalized itching & hives with no known cause; 2 benadryl taken PTA

## 2016-04-24 NOTE — ED Notes (Signed)
See triage note  States he developed a generalized rash with some itching yesterday  Also noted some minor swelling around lips. No resp distress noted Recently started back on septra ds but has taken septra in past

## 2016-04-24 NOTE — Discharge Instructions (Signed)
Discontinue Bactrim DS. Advised to follow-up with prescribing doctor for alternate therapy.

## 2017-05-28 ENCOUNTER — Encounter: Payer: Self-pay | Admitting: Medical

## 2017-05-28 ENCOUNTER — Ambulatory Visit (INDEPENDENT_AMBULATORY_CARE_PROVIDER_SITE_OTHER): Payer: Managed Care, Other (non HMO) | Admitting: Medical

## 2017-05-28 VITALS — BP 108/65 | HR 61 | Temp 98.3°F | Resp 16 | Ht 72.0 in | Wt 186.4 lb

## 2017-05-28 DIAGNOSIS — N400 Enlarged prostate without lower urinary tract symptoms: Secondary | ICD-10-CM

## 2017-05-28 DIAGNOSIS — Z0000 Encounter for general adult medical examination without abnormal findings: Secondary | ICD-10-CM | POA: Diagnosis not present

## 2017-05-28 LAB — CBC WITH DIFFERENTIAL/PLATELET
Basophils Absolute: 0 10*3/uL (ref 0.0–0.1)
Basophils Relative: 0.7 % (ref 0.0–3.0)
EOS PCT: 4.3 % (ref 0.0–5.0)
Eosinophils Absolute: 0.2 10*3/uL (ref 0.0–0.7)
HCT: 42.5 % (ref 39.0–52.0)
HEMOGLOBIN: 14.4 g/dL (ref 13.0–17.0)
Lymphocytes Relative: 26.2 % (ref 12.0–46.0)
Lymphs Abs: 1 10*3/uL (ref 0.7–4.0)
MCHC: 33.9 g/dL (ref 30.0–36.0)
MCV: 89 fl (ref 78.0–100.0)
MONO ABS: 0.4 10*3/uL (ref 0.1–1.0)
MONOS PCT: 10 % (ref 3.0–12.0)
Neutro Abs: 2.2 10*3/uL (ref 1.4–7.7)
Neutrophils Relative %: 58.8 % (ref 43.0–77.0)
Platelets: 200 10*3/uL (ref 150.0–400.0)
RBC: 4.78 Mil/uL (ref 4.22–5.81)
RDW: 13.5 % (ref 11.5–15.5)
WBC: 3.7 10*3/uL — AB (ref 4.0–10.5)

## 2017-05-28 LAB — COMPREHENSIVE METABOLIC PANEL
ALBUMIN: 4.8 g/dL (ref 3.5–5.2)
ALK PHOS: 37 U/L — AB (ref 39–117)
ALT: 19 U/L (ref 0–53)
AST: 18 U/L (ref 0–37)
BILIRUBIN TOTAL: 0.7 mg/dL (ref 0.2–1.2)
BUN: 15 mg/dL (ref 6–23)
CO2: 32 mEq/L (ref 19–32)
CREATININE: 0.88 mg/dL (ref 0.40–1.50)
Calcium: 9.7 mg/dL (ref 8.4–10.5)
Chloride: 103 mEq/L (ref 96–112)
GFR: 98.67 mL/min (ref >60.00)
Glucose, Bld: 91 mg/dL (ref 70–99)
POTASSIUM: 4.5 meq/L (ref 3.5–5.1)
Sodium: 140 mEq/L (ref 135–145)
TOTAL PROTEIN: 7.1 g/dL (ref 6.0–8.3)

## 2017-05-28 LAB — LIPID PANEL
CHOLESTEROL: 172 mg/dL (ref 0–200)
HDL: 44.4 mg/dL (ref >39.00)
LDL Cholesterol: 108 mg/dL — ABNORMAL HIGH (ref 0–99)
NonHDL: 127.88
Total CHOL/HDL Ratio: 4
Triglycerides: 100 mg/dL (ref 0.0–149.0)
VLDL: 20 mg/dL (ref 0.0–40.0)

## 2017-05-28 LAB — URINALYSIS, ROUTINE W REFLEX MICROSCOPIC
BILIRUBIN URINE: NEGATIVE
HGB URINE DIPSTICK: NEGATIVE
Ketones, ur: NEGATIVE
LEUKOCYTES UA: NEGATIVE
NITRITE: NEGATIVE
RBC / HPF: NONE SEEN (ref 0–?)
Specific Gravity, Urine: 1.01 (ref 1.000–1.030)
Total Protein, Urine: NEGATIVE
URINE GLUCOSE: NEGATIVE
UROBILINOGEN UA: 0.2 (ref 0.0–1.0)
pH: 7 (ref 5.0–8.0)

## 2017-05-28 LAB — PSA: PSA: 1.26 ng/mL (ref 0.10–4.00)

## 2017-05-28 NOTE — Patient Instructions (Addendum)
For you wellness exam today I have ordered cbc, cmp, lipid panel, ua and hiv. Added psa as well  Vaccine up to date.  Recommend exercise and healthy diet.  We will let you know lab results as they come in.  Follow up date appointment will be determined after lab review.    Preventive Care 40-64 Years, Male Preventive care refers to lifestyle choices and visits with your health care provider that can promote health and wellness. What does preventive care include?  A yearly physical exam. This is also called an annual well check.  Dental exams once or twice a year.  Routine eye exams. Ask your health care provider how often you should have your eyes checked.  Personal lifestyle choices, including: ? Daily care of your teeth and gums. ? Regular physical activity. ? Eating a healthy diet. ? Avoiding tobacco and drug use. ? Limiting alcohol use. ? Practicing safe sex. ? Taking low-dose aspirin every day starting at age 56. What happens during an annual well check? The services and screenings done by your health care provider during your annual well check will depend on your age, overall health, lifestyle risk factors, and family history of disease. Counseling Your health care provider may ask you questions about your:  Alcohol use.  Tobacco use.  Drug use.  Emotional well-being.  Home and relationship well-being.  Sexual activity.  Eating habits.  Work and work Statistician.  Screening You may have the following tests or measurements:  Height, weight, and BMI.  Blood pressure.  Lipid and cholesterol levels. These may be checked every 5 years, or more frequently if you are over 52 years old.  Skin check.  Lung cancer screening. You may have this screening every year starting at age 56 if you have a 30-pack-year history of smoking and currently smoke or have quit within the past 15 years.  Fecal occult blood test (FOBT) of the stool. You may have this test every  year starting at age 43.  Flexible sigmoidoscopy or colonoscopy. You may have a sigmoidoscopy every 5 years or a colonoscopy every 10 years starting at age 40.  Prostate cancer screening. Recommendations will vary depending on your family history and other risks.  Hepatitis C blood test.  Hepatitis B blood test.  Sexually transmitted disease (STD) testing.  Diabetes screening. This is done by checking your blood sugar (glucose) after you have not eaten for a while (fasting). You may have this done every 1-3 years.  Discuss your test results, treatment options, and if necessary, the need for more tests with your health care provider. Vaccines Your health care provider may recommend certain vaccines, such as:  Influenza vaccine. This is recommended every year.  Tetanus, diphtheria, and acellular pertussis (Tdap, Td) vaccine. You may need a Td booster every 10 years.  Varicella vaccine. You may need this if you have not been vaccinated.  Zoster vaccine. You may need this after age 64.  Measles, mumps, and rubella (MMR) vaccine. You may need at least one dose of MMR if you were born in 1957 or later. You may also need a second dose.  Pneumococcal 13-valent conjugate (PCV13) vaccine. You may need this if you have certain conditions and have not been vaccinated.  Pneumococcal polysaccharide (PPSV23) vaccine. You may need one or two doses if you smoke cigarettes or if you have certain conditions.  Meningococcal vaccine. You may need this if you have certain conditions.  Hepatitis A vaccine. You may need this if  you have certain conditions or if you travel or work in places where you may be exposed to hepatitis A.  Hepatitis B vaccine. You may need this if you have certain conditions or if you travel or work in places where you may be exposed to hepatitis B.  Haemophilus influenzae type b (Hib) vaccine. You may need this if you have certain risk factors.  Talk to your health care  provider about which screenings and vaccines you need and how often you need them. This information is not intended to replace advice given to you by your health care provider. Make sure you discuss any questions you have with your health care provider. Document Released: 02/17/2015 Document Revised: 10/11/2015 Document Reviewed: 11/22/2014 Elsevier Interactive Patient Education  [employer].

## 2017-05-28 NOTE — Progress Notes (Signed)
Subjective:    Patient ID: Peter Murphy, male    DOB: 08/06/1970, 47 y.o.   MRN: [id number]  HPI  Pt in for follow up.  Pt has runny nose and sore throat. Mild itching eyes and sneeze. He had pnd.   He states now feels better. Mild spring allergies. No treatment needed per pt. On review up to date on vaccines.  Pt states has been eating healthy. He lost 15 pounds eating healthier. Pt has not been exercising. Here for CPE. He is fasting.    Review of Systems  Constitutional: Negative for chills, fatigue and fever.  HENT:       See hpi. Mild residual resolving allergy symptoms.  Respiratory: Negative for cough, chest tightness and shortness of breath.   Cardiovascular: Negative for chest pain and palpitations.  Gastrointestinal: Negative for abdominal pain.  Genitourinary: Negative for dysuria, frequency, penile pain and urgency.  Musculoskeletal: Negative for back pain, gait problem and neck pain.  Skin: Negative for rash.  Neurological: Negative for dizziness, weakness, light-headedness and numbness.  Hematological: Negative for adenopathy. Does not bruise/bleed easily.  Psychiatric/Behavioral: Negative for behavioral problems, confusion and sleep disturbance. The patient is not nervous/anxious.    No past medical history on file.   Social History   Socioeconomic History  . Marital status: Married    Spouse name: Not on file  . Number of children: Not on file  . Years of education: Not on file  . Highest education level: Not on file  Occupational History  . Not on file  Social Needs  . Financial resource strain: Not on file  . Food insecurity:    Worry: Not on file    Inability: Not on file  . Transportation needs:    Medical: Not on file    Non-medical: Not on file  Tobacco Use  . Smoking status: Former Smoker    Years: 14.00    Types: Cigarettes    Last attempt to quit: 01/02/2006    Years since quitting: 11.4  . Smokeless tobacco: Never Used    Substance and Sexual Activity  . Alcohol use: Yes    Alcohol/week: 0.0 oz    Comment: usually rare but recent one event drank alot.  . Drug use: No  . Sexual activity: Yes  Lifestyle  . Physical activity:    Days per week: Not on file    Minutes per session: Not on file  . Stress: Not on file  Relationships  . Social connections:    Talks on phone: Not on file    Gets together: Not on file    Attends religious service: Not on file    Active member of club or organization: Not on file    Attends meetings of clubs or organizations: Not on file    Relationship status: Not on file  . Intimate partner violence:    Fear of current or ex partner: Not on file    Emotionally abused: Not on file    Physically abused: Not on file    Forced sexual activity: Not on file  Other Topics Concern  . Not on file  Social History Narrative  . Not on file    Past Surgical History:  Procedure Laterality Date  . APPENDECTOMY  2010    Family History  Problem Relation Age of Onset  . Diabetes Mother     No Known Allergies  Current Outpatient Medications on File Prior to Visit  Medication Sig Dispense Refill  .  phenazopyridine (PYRIDIUM) 200 MG tablet Take 200 mg by mouth 3 (three) times daily as needed for pain.    Marland Kitchen QUEtiapine (SEROQUEL) 100 MG tablet Take 150 mg by mouth at bedtime.    . sulfamethoxazole-trimethoprim (BACTRIM DS,SEPTRA DS) 800-160 MG tablet Take 1 tablet by mouth 2 (two) times daily.    . tamsulosin (FLOMAX) 0.4 MG CAPS capsule Take 0.4 mg by mouth.     No current facility-administered medications on file prior to visit.     BP 108/65   Pulse 61   Temp 98.3 F (36.8 C) (Oral)   Resp 16   Wt 186 lb 6.4 oz (84.6 kg)   SpO2 100%   BMI 25.28 kg/m       Objective:   Physical Exam  General Mental Status- Alert. General Appearance- Not in acute distress.   Skin General: Color- Normal Color. Moisture- Normal Moisture.  Neck Carotid Arteries- Normal color.  Moisture- Normal Moisture. No carotid bruits. No JVD.  Chest and Lung Exam Auscultation: Breath Sounds:-Normal.  Cardiovascular Auscultation:Rythm- Regular. Murmurs & Other Heart Sounds:Auscultation of the heart reveals- No Murmurs.  Abdomen Inspection:-Inspeection Normal. Palpation/Percussion:Note:No mass. Palpation and Percussion of the abdomen reveal- Non Tender, Non Distended + BS, no rebound or guarding.    Neurologic Cranial Nerve exam:- CN III-XII intact(No nystagmus), symmetric smile. Strength:- 5/5 equal and symmetric strength both upper and lower extremities.  Genital-Urethra:- No discharge. Penis- Circumcised. Scrotum- No masses. Testes- Bilateral-Normal. No hernias felt on exam. No rash on scrotum or penis.  Rectal-Anorectal Exam: Performed- Normal sphincter tone. No masses noted. Prostate smooth but  Mild-moderate  enlarged on exam. Stool HEME Negative.        Assessment & Plan:  For you wellness exam today I have ordered cbc, cmp, lipid panel, ua and hiv. Added psa as well.  Vaccine up to date.  Recommend exercise and healthy diet.  We will let you know lab results as they come in.  Follow up date appointment will be determined after lab review.   Esperanza Richters, PA-C

## 2018-06-16 ENCOUNTER — Encounter: Payer: Managed Care, Other (non HMO) | Admitting: Medical

## 2018-07-10 ENCOUNTER — Encounter: Payer: Managed Care, Other (non HMO) | Admitting: Medical

## 2018-08-05 ENCOUNTER — Other Ambulatory Visit: Payer: Self-pay

## 2018-08-05 ENCOUNTER — Encounter: Payer: Self-pay | Admitting: Medical

## 2018-08-05 ENCOUNTER — Ambulatory Visit (INDEPENDENT_AMBULATORY_CARE_PROVIDER_SITE_OTHER): Payer: Managed Care, Other (non HMO) | Admitting: Medical

## 2018-08-05 VITALS — BP 110/55 | HR 66 | Temp 98.2°F | Resp 16 | Ht 72.0 in | Wt 194.6 lb

## 2018-08-05 DIAGNOSIS — Z125 Encounter for screening for malignant neoplasm of prostate: Secondary | ICD-10-CM

## 2018-08-05 DIAGNOSIS — Z0000 Encounter for general adult medical examination without abnormal findings: Secondary | ICD-10-CM | POA: Diagnosis not present

## 2018-08-05 DIAGNOSIS — Z1283 Encounter for screening for malignant neoplasm of skin: Secondary | ICD-10-CM | POA: Diagnosis not present

## 2018-08-05 NOTE — Patient Instructions (Addendum)
For you wellness exam today I have ordered cbc, cmp, lipid panel and psa.  Vaccine up to date.  Recommend exercise and healthy diet.  We will let you know lab results as they come in.  Follow up date appointment will be determined after lab review.    Preventive Care 80-48 Years Old, Male Preventive care refers to lifestyle choices and visits with your health care provider that can promote health and wellness. This includes:  A yearly physical exam. This is also called an annual well check.  Regular dental and eye exams.  Immunizations.  Screening for certain conditions.  Healthy lifestyle choices, such as eating a healthy diet, getting regular exercise, not using drugs or products that contain nicotine and tobacco, and limiting alcohol use. What can I expect for my preventive care visit? Physical exam Your health care provider will check:  Height and weight. These may be used to calculate body mass index (BMI), which is a measurement that tells if you are at a healthy weight.  Heart rate and blood pressure.  Your skin for abnormal spots. Counseling Your health care provider may ask you questions about:  Alcohol, tobacco, and drug use.  Emotional well-being.  Home and relationship well-being.  Sexual activity.  Eating habits.  Work and work Statistician. What immunizations do I need?  Influenza (flu) vaccine  This is recommended every year. Tetanus, diphtheria, and pertussis (Tdap) vaccine  You may need a Td booster every 10 years. Varicella (chickenpox) vaccine  You may need this vaccine if you have not already been vaccinated. Zoster (shingles) vaccine  You may need this after age 51. Measles, mumps, and rubella (MMR) vaccine  You may need at least one dose of MMR if you were born in 1957 or later. You may also need a second dose. Pneumococcal conjugate (PCV13) vaccine  You may need this if you have certain conditions and were not previously  vaccinated. Pneumococcal polysaccharide (PPSV23) vaccine  You may need one or two doses if you smoke cigarettes or if you have certain conditions. Meningococcal conjugate (MenACWY) vaccine  You may need this if you have certain conditions. Hepatitis A vaccine  You may need this if you have certain conditions or if you travel or work in places where you may be exposed to hepatitis A. Hepatitis B vaccine  You may need this if you have certain conditions or if you travel or work in places where you may be exposed to hepatitis B. Haemophilus influenzae type b (Hib) vaccine  You may need this if you have certain risk factors. Human papillomavirus (HPV) vaccine  If recommended by your health care provider, you may need three doses over 6 months. You may receive vaccines as individual doses or as more than one vaccine together in one shot (combination vaccines). Talk with your health care provider about the risks and benefits of combination vaccines. What tests do I need? Blood tests  Lipid and cholesterol levels. These may be checked every 5 years, or more frequently if you are over 15 years old.  Hepatitis C test.  Hepatitis B test. Screening  Lung cancer screening. You may have this screening every year starting at age 58 if you have a 30-pack-year history of smoking and currently smoke or have quit within the past 15 years.  Prostate cancer screening. Recommendations will vary depending on your family history and other risks.  Colorectal cancer screening. All adults should have this screening starting at age 86 and continuing until age  75. Your health care provider may recommend screening at age 31 if you are at increased risk. You will have tests every 1-10 years, depending on your results and the type of screening test.  Diabetes screening. This is done by checking your blood sugar (glucose) after you have not eaten for a while (fasting). You may have this done every 1-3  years.  Sexually transmitted disease (STD) testing. Follow these instructions at home: Eating and drinking  Eat a diet that includes fresh fruits and vegetables, whole grains, lean protein, and low-fat dairy products.  Take vitamin and mineral supplements as recommended by your health care provider.  Do not drink alcohol if your health care provider tells you not to drink.  If you drink alcohol: ? Limit how much you have to 0-2 drinks a day. ? Be aware of how much alcohol is in your drink. In the [place] one drink equals one 12 oz bottle of beer (355 mL), one 5 oz glass of wine (148 mL), or one 1 oz glass of hard liquor (44 mL). Lifestyle  Take daily care of your teeth and gums.  Stay active. Exercise for at least 30 minutes on 5 or more days each week.  Do not use any products that contain nicotine or tobacco, such as cigarettes, e-cigarettes, and chewing tobacco. If you need help quitting, ask your health care provider.  If you are sexually active, practice safe sex. Use a condom or other form of protection to prevent STIs (sexually transmitted infections).  Talk with your health care provider about taking a low-dose aspirin every day starting at age 91. What's next?  Go to your health care provider once a year for a well check visit.  Ask your health care provider how often you should have your eyes and teeth checked.  Stay up to date on all vaccines. This information is not intended to replace advice given to you by your health care provider. Make sure you discuss any questions you have with your health care provider. Document Released: 02/17/2015 Document Revised: 01/15/2018 Document Reviewed: 01/15/2018 Elsevier Patient Education  2020 [employer].

## 2018-08-05 NOTE — Progress Notes (Signed)
Subjective:    Patient ID: Peter Murphy, male    DOB: 10/26/1970, 48 y.o.   MRN: [id number]  HPI  Pt in for cpe.  Pt [place] [employer] Pt has been exercising. Eating out a lot recently. Nonsmoker, no alcohol.   Up to date on vaccines. Reminder to get flu vaccine little early this year.    Review of Systems  Constitutional: Negative for chills and fever.  HENT: Negative for congestion, drooling and ear discharge.   Respiratory: Negative for cough, chest tightness, shortness of breath and wheezing.   Cardiovascular: Negative for chest pain and palpitations.  Gastrointestinal: Negative for abdominal pain, blood in stool, nausea and vomiting.  Genitourinary: Negative for difficulty urinating, enuresis, genital sores and urgency.  Musculoskeletal: Negative for back pain and neck pain.  Skin: Negative for rash.  Neurological: Negative for dizziness, speech difficulty, weakness, numbness and headaches.  Hematological: Negative for adenopathy. Does not bruise/bleed easily.  Psychiatric/Behavioral: Negative for behavioral problems, confusion and sleep disturbance. The patient is not nervous/anxious.    No past medical history on file.   Social History   Socioeconomic History  . Marital status: Married    Spouse name: Not on file  . Number of children: Not on file  . Years of education: Not on file  . Highest education level: Not on file  Occupational History  . Not on file  Social Needs  . Financial resource strain: Not on file  . Food insecurity    Worry: Not on file    Inability: Not on file  . Transportation needs    Medical: Not on file    Non-medical: Not on file  Tobacco Use  . Smoking status: Former Smoker    Years: 14.00    Types: Cigarettes    Quit date: 01/02/2006    Years since quitting: 12.5  . Smokeless tobacco: Never Used  Substance and Sexual Activity  . Alcohol use: Yes    Alcohol/week: 0.0 standard drinks    Comment:  usually rare but recent one event drank alot.  . Drug use: No  . Sexual activity: Yes  Lifestyle  . Physical activity    Days per week: Not on file    Minutes per session: Not on file  . Stress: Not on file  Relationships  . Social Musicianconnections    Talks on phone: Not on file    Gets together: Not on file    Attends religious service: Not on file    Active member of club or organization: Not on file    Attends meetings of clubs or organizations: Not on file    Relationship status: Not on file  . Intimate partner violence    Fear of current or ex partner: Not on file    Emotionally abused: Not on file    Physically abused: Not on file    Forced sexual activity: Not on file  Other Topics Concern  . Not on file  Social History Narrative  . Not on file    Past Surgical History:  Procedure Laterality Date  . APPENDECTOMY  2010    Family History  Problem Relation Age of Onset  . Diabetes Mother     No Known Allergies  Current Outpatient Medications on File Prior to Visit  Medication Sig Dispense Refill  . phenazopyridine (PYRIDIUM) 200 MG tablet Take 200 mg by mouth 3 (three) times daily as needed for pain.    Marland Kitchen. QUEtiapine (SEROQUEL) 100  MG tablet Take 150 mg by mouth at bedtime.    . sulfamethoxazole-trimethoprim (BACTRIM DS,SEPTRA DS) 800-160 MG tablet Take 1 tablet by mouth 2 (two) times daily.    . tamsulosin (FLOMAX) 0.4 MG CAPS capsule Take 0.4 mg by mouth.     No current facility-administered medications on file prior to visit.     BP (!) 110/55   Pulse 66   Temp 98.2 F (36.8 C) (Oral)   Resp 16   Ht 6' (1.829 m)   Wt 194 lb 9.6 oz (88.3 kg)   SpO2 98%   BMI 26.39 kg/m       Objective:   Physical Exam   General Mental Status- Alert. General Appearance- Not in acute distress.   Skin General: Color- Normal Color. Moisture- Normal Moisture. Scattered moderate sized moles present. One left lower back dark mild irregular.  Neck Carotid Arteries-  Normal color. Moisture- Normal Moisture. No carotid bruits. No JVD.  Chest and Lung Exam Auscultation: Breath Sounds:-Normal.  Cardiovascular Auscultation:Rythm- Regular. Murmurs & Other Heart Sounds:Auscultation of the heart reveals- No Murmurs.  Abdomen Inspection:-Inspeection Normal. Palpation/Percussion:Note:No mass. Palpation and Percussion of the abdomen reveal- Non Tender, Non Distended + BS, no rebound or guarding.   Neurologic Cranial Nerve exam:- CN III-XII intact(No nystagmus), symmetric smile. Normal/IntactStrength:- 5/5 equal and symmetric strength both upper and lower extremities.  Genital/rectal- deferred this year.     Assessment & Plan:  For you wellness exam today I have ordered cbc, cmp, lipid panel and psa.  Vaccine up to date.  Recommend exercise and healthy diet.  We will let you know lab results as they come in.  Follow up date appointment will be determined after lab review.   Mackie Pai, PA-C

## 2018-08-06 ENCOUNTER — Telehealth: Payer: Self-pay | Admitting: Medical

## 2018-08-06 DIAGNOSIS — R944 Abnormal results of kidney function studies: Secondary | ICD-10-CM

## 2018-08-06 LAB — LIPID PANEL
Cholesterol: 147 mg/dL (ref 0–200)
HDL: 43.3 mg/dL (ref >39.00)
LDL Cholesterol: 88 mg/dL (ref 0–99)
NonHDL: 103.31
Total CHOL/HDL Ratio: 3
Triglycerides: 79 mg/dL (ref 0.0–149.0)
VLDL: 15.8 mg/dL (ref 0.0–40.0)

## 2018-08-06 LAB — CBC WITH DIFFERENTIAL/PLATELET
Basophils Absolute: 0 10*3/uL (ref 0.0–0.1)
Basophils Relative: 0.8 % (ref 0.0–3.0)
Eosinophils Absolute: 0.1 10*3/uL (ref 0.0–0.7)
Eosinophils Relative: 1.4 % (ref 0.0–5.0)
HCT: 40.8 % (ref 39.0–52.0)
Hemoglobin: 13.6 g/dL (ref 13.0–17.0)
Lymphocytes Relative: 27.8 % (ref 12.0–46.0)
Lymphs Abs: 1.4 10*3/uL (ref 0.7–4.0)
MCHC: 33.3 g/dL (ref 30.0–36.0)
MCV: 90.6 fl (ref 78.0–100.0)
Monocytes Absolute: 0.3 10*3/uL (ref 0.1–1.0)
Monocytes Relative: 6.7 % (ref 3.0–12.0)
Neutro Abs: 3.2 10*3/uL (ref 1.4–7.7)
Neutrophils Relative %: 63.3 % (ref 43.0–77.0)
Platelets: 186 10*3/uL (ref 150.0–400.0)
RBC: 4.5 Mil/uL (ref 4.22–5.81)
RDW: 13 % (ref 11.5–15.5)
WBC: 5 10*3/uL (ref 4.0–10.5)

## 2018-08-06 LAB — COMPREHENSIVE METABOLIC PANEL
ALT: 12 U/L (ref 0–53)
AST: 14 U/L (ref 0–37)
Albumin: 4.5 g/dL (ref 3.5–5.2)
Alkaline Phosphatase: 38 U/L — ABNORMAL LOW (ref 39–117)
BUN: 15 mg/dL (ref 6–23)
CO2: 28 mEq/L (ref 19–32)
Calcium: 8.9 mg/dL (ref 8.4–10.5)
Chloride: 104 mEq/L (ref 96–112)
Creatinine, Ser: 1.04 mg/dL (ref 0.40–1.50)
GFR: 76.17 mL/min (ref >60.00)
Glucose, Bld: 83 mg/dL (ref 70–99)
Potassium: 4.1 mEq/L (ref 3.5–5.1)
Sodium: 140 mEq/L (ref 135–145)
Total Bilirubin: 0.7 mg/dL (ref 0.2–1.2)
Total Protein: 6.7 g/dL (ref 6.0–8.3)

## 2018-08-06 LAB — PSA: PSA: 1.38 ng/mL (ref 0.10–4.00)

## 2018-08-06 NOTE — Telephone Encounter (Signed)
Future cmp order placed. 

## 2018-08-09 ENCOUNTER — Telehealth: Payer: Self-pay | Admitting: Medical

## 2018-08-09 NOTE — Telephone Encounter (Signed)
I filled out pt biometric form for physical. Will you fax that over and keep copy of that form. Will put that on your desk.

## 2018-08-10 NOTE — Telephone Encounter (Signed)
Form faxed

## 2018-12-16 ENCOUNTER — Other Ambulatory Visit: Payer: Self-pay

## 2018-12-16 DIAGNOSIS — Z20822 Contact with and (suspected) exposure to covid-19: Secondary | ICD-10-CM

## 2018-12-17 ENCOUNTER — Encounter: Payer: Self-pay | Admitting: Medical

## 2018-12-17 LAB — NOVEL CORONAVIRUS, NAA: SARS-CoV-2, NAA: NOT DETECTED

## 2019-08-04 ENCOUNTER — Encounter: Payer: Self-pay | Admitting: Medical

## 2019-08-04 ENCOUNTER — Ambulatory Visit (INDEPENDENT_AMBULATORY_CARE_PROVIDER_SITE_OTHER): Payer: Managed Care, Other (non HMO) | Admitting: Medical

## 2019-08-04 ENCOUNTER — Other Ambulatory Visit: Payer: Self-pay

## 2019-08-04 VITALS — BP 112/57 | HR 61 | Temp 97.8°F | Resp 18 | Ht 72.0 in | Wt 191.0 lb

## 2019-08-04 DIAGNOSIS — Z125 Encounter for screening for malignant neoplasm of prostate: Secondary | ICD-10-CM

## 2019-08-04 DIAGNOSIS — Z1211 Encounter for screening for malignant neoplasm of colon: Secondary | ICD-10-CM | POA: Diagnosis not present

## 2019-08-04 DIAGNOSIS — Z0000 Encounter for general adult medical examination without abnormal findings: Secondary | ICD-10-CM | POA: Diagnosis not present

## 2019-08-04 LAB — COMPREHENSIVE METABOLIC PANEL
ALT: 16 U/L (ref 0–53)
AST: 14 U/L (ref 0–37)
Albumin: 4.7 g/dL (ref 3.5–5.2)
Alkaline Phosphatase: 39 U/L (ref 39–117)
BUN: 20 mg/dL (ref 6–23)
CO2: 29 mEq/L (ref 19–32)
Calcium: 9.3 mg/dL (ref 8.4–10.5)
Chloride: 104 mEq/L (ref 96–112)
Creatinine, Ser: 1.05 mg/dL (ref 0.40–1.50)
GFR: 75.02 mL/min (ref >60.00)
Glucose, Bld: 97 mg/dL (ref 70–99)
Potassium: 4.6 mEq/L (ref 3.5–5.1)
Sodium: 140 mEq/L (ref 135–145)
Total Bilirubin: 0.5 mg/dL (ref 0.2–1.2)
Total Protein: 6.7 g/dL (ref 6.0–8.3)

## 2019-08-04 LAB — CBC WITH DIFFERENTIAL/PLATELET
Basophils Absolute: 0 10*3/uL (ref 0.0–0.1)
Basophils Relative: 0.7 % (ref 0.0–3.0)
Eosinophils Absolute: 0.1 10*3/uL (ref 0.0–0.7)
Eosinophils Relative: 2.8 % (ref 0.0–5.0)
HCT: 41.5 % (ref 39.0–52.0)
Hemoglobin: 13.8 g/dL (ref 13.0–17.0)
Lymphocytes Relative: 30.9 % (ref 12.0–46.0)
Lymphs Abs: 1.2 10*3/uL (ref 0.7–4.0)
MCHC: 33.2 g/dL (ref 30.0–36.0)
MCV: 91.5 fl (ref 78.0–100.0)
Monocytes Absolute: 0.3 10*3/uL (ref 0.1–1.0)
Monocytes Relative: 7.7 % (ref 3.0–12.0)
Neutro Abs: 2.2 10*3/uL (ref 1.4–7.7)
Neutrophils Relative %: 57.9 % (ref 43.0–77.0)
Platelets: 184 10*3/uL (ref 150.0–400.0)
RBC: 4.54 Mil/uL (ref 4.22–5.81)
RDW: 13.3 % (ref 11.5–15.5)
WBC: 3.7 10*3/uL — ABNORMAL LOW (ref 4.0–10.5)

## 2019-08-04 LAB — PSA: PSA: 1.56 ng/mL (ref 0.10–4.00)

## 2019-08-04 LAB — LIPID PANEL
Cholesterol: 172 mg/dL (ref 0–200)
HDL: 49.5 mg/dL (ref >39.00)
LDL Cholesterol: 110 mg/dL — ABNORMAL HIGH (ref 0–99)
NonHDL: 122.49
Total CHOL/HDL Ratio: 3
Triglycerides: 63 mg/dL (ref 0.0–149.0)
VLDL: 12.6 mg/dL (ref 0.0–40.0)

## 2019-08-04 NOTE — Progress Notes (Signed)
Subjective:    Patient ID: Peter Murphy, male    DOB: 23-Jul-1970, 49 y.o.   MRN: [id number]  HPI  Pt in for cpe.  Pt is working for [employer]. Pt has been exercising 3 days a week.. Eating out a lot recently. Nonsmoker, no alcohol.   Up to date on vaccines. Reminder to get flu vaccine little early this year.  Pt has had covid vaccines. Phizer per pt.  Pt never had colonoscopy. Pt has no family history of colon cancer.      Review of Systems  Constitutional: Negative for chills, fatigue and fever.  HENT: Negative for congestion.   Respiratory: Negative for cough, chest tightness, shortness of breath and wheezing.   Cardiovascular: Negative for chest pain and palpitations.  Gastrointestinal: Negative for abdominal pain.  Genitourinary: Negative for dysuria, flank pain, frequency, testicular pain and urgency.  Musculoskeletal: Negative for back pain and myalgias.  Skin: Negative for rash.  Neurological: Negative for dizziness and headaches.  Hematological: Negative for adenopathy. Does not bruise/bleed easily.  Psychiatric/Behavioral: Negative for behavioral problems and dysphoric mood.    No past medical history on file.   Social History   Socioeconomic History  . Marital status: Married    Spouse name: Not on file  . Number of children: Not on file  . Years of education: Not on file  . Highest education level: Not on file  Occupational History  . Not on file  Tobacco Use  . Smoking status: Former Smoker    Years: 14.00    Types: Cigarettes    Quit date: 01/02/2006    Years since quitting: 13.5  . Smokeless tobacco: Never Used  Substance and Sexual Activity  . Alcohol use: Yes    Alcohol/week: 0.0 standard drinks    Comment: usually rare but recent one event drank alot.  . Drug use: No  . Sexual activity: Yes  Other Topics Concern  . Not on file  Social History Narrative  . Not on file   Social Determinants of Health   Financial  Resource Strain:   . Difficulty of Paying Living Expenses:   Food Insecurity:   . Worried About Programme researcher, broadcasting/film/video in the Last Year:   . Barista in the Last Year:   Transportation Needs:   . Freight forwarder (Medical):   Marland Kitchen Lack of Transportation (Non-Medical):   Physical Activity:   . Days of Exercise per Week:   . Minutes of Exercise per Session:   Stress:   . Feeling of Stress :   Social Connections:   . Frequency of Communication with Friends and Family:   . Frequency of Social Gatherings with Friends and Family:   . Attends Religious Services:   . Active Member of Clubs or Organizations:   . Attends Banker Meetings:   Marland Kitchen Marital Status:   Intimate Partner Violence:   . Fear of Current or Ex-Partner:   . Emotionally Abused:   Marland Kitchen Physically Abused:   . Sexually Abused:     Past Surgical History:  Procedure Laterality Date  . APPENDECTOMY  2010    Family History  Problem Relation Age of Onset  . Diabetes Mother     No Known Allergies  Current Outpatient Medications on File Prior to Visit  Medication Sig Dispense Refill  . phenazopyridine (PYRIDIUM) 200 MG tablet Take 200 mg by mouth 3 (three) times daily as needed for pain. (Patient not taking: Reported  on 08/04/2019)    . QUEtiapine (SEROQUEL) 100 MG tablet Take 150 mg by mouth at bedtime. (Patient not taking: Reported on 08/04/2019)    . sulfamethoxazole-trimethoprim (BACTRIM DS,SEPTRA DS) 800-160 MG tablet Take 1 tablet by mouth 2 (two) times daily. (Patient not taking: Reported on 08/04/2019)    . tamsulosin (FLOMAX) 0.4 MG CAPS capsule Take 0.4 mg by mouth. (Patient not taking: Reported on 08/04/2019)     No current facility-administered medications on file prior to visit.    BP (!) 112/57 (BP Location: Left Arm, Patient Position: Sitting, Cuff Size: Normal)   Pulse 61   Temp 97.8 F (36.6 C) (Temporal)   Resp 18   Ht 6' (1.829 m)   Wt 191 lb (86.6 kg)   SpO2 99%   BMI 25.90 kg/m         Objective:   Physical Exam  General Mental Status- Alert. General Appearance- Not in acute distress.   Skin General: Color- Normal Color. Moisture- Normal Moisture.  Scattered small moles anterior and posterior thorax,   Neck Carotid Arteries- Normal color. Moisture- Normal Moisture. No carotid bruits. No JVD.  Chest and Lung Exam Auscultation: Breath Sounds:-Normal.  Cardiovascular Auscultation:Rythm- Regular. Murmurs & Other Heart Sounds:Auscultation of the heart reveals- No Murmurs.  Abdomen Inspection:-Inspeection Normal. Palpation/Percussion:Note:No mass. Palpation and Percussion of the abdomen reveal- Non Tender, Non Distended + BS, no rebound or guarding.   Neurologic Cranial Nerve exam:- CN III-XII intact(No nystagmus), symmetric smile. Strength:- 5/5 equal and symmetric strength both upper and lower extremities.  Rectal- smooth prostate. Not enlarged. Normal sphincter tone. Hem negative stool. Genital exam- no hernia on exam. Normal testicles. Non tender.     Assessment & Plan:  For you wellness exam today I have ordered cbc, cmp, lipid panel and psa.  Vaccine appear up to date. Ask if you have covid vaccine card send me my chart message with dates and lot number.  Recommend exercise and healthy diet.  We will let you know lab results as they come in.  Follow up date appointment will be determined after lab review.   Referral placed for screening colonoscopy.  Pt will self refer to prior derm saw 2 years ago. If needs referral please contact us.  Esperanza Richters, PA-C

## 2019-08-04 NOTE — Patient Instructions (Addendum)
For you wellness exam today I have ordered cbc, cmp, lipid panel and psa.  Vaccine appear up to date. Ask if you have covid vaccine card send me my chart message with dates and lot number.  Recommend exercise and healthy diet.  We will let you know lab results as they come in.  Follow up date appointment will be determined after lab review.   Referral placed for screening colonoscopy.  Pt will self refer to prior derm saw 2 years ago. If needs referral please contact us.  We will fill out form when labs are back.    Preventive Care 83-18 Years Old, Male Preventive care refers to lifestyle choices and visits with your health care provider that can promote health and wellness. This includes:  A yearly physical exam. This is also called an annual well check.  Regular dental and eye exams.  Immunizations.  Screening for certain conditions.  Healthy lifestyle choices, such as eating a healthy diet, getting regular exercise, not using drugs or products that contain nicotine and tobacco, and limiting alcohol use. What can I expect for my preventive care visit? Physical exam Your health care provider will check:  Height and weight. These may be used to calculate body mass index (BMI), which is a measurement that tells if you are at a healthy weight.  Heart rate and blood pressure.  Your skin for abnormal spots. Counseling Your health care provider may ask you questions about:  Alcohol, tobacco, and drug use.  Emotional well-being.  Home and relationship well-being.  Sexual activity.  Eating habits.  Work and work Statistician. What immunizations do I need?  Influenza (flu) vaccine  This is recommended every year. Tetanus, diphtheria, and pertussis (Tdap) vaccine  You may need a Td booster every 10 years. Varicella (chickenpox) vaccine  You may need this vaccine if you have not already been vaccinated. Zoster (shingles) vaccine  You may need this after age  46. Measles, mumps, and rubella (MMR) vaccine  You may need at least one dose of MMR if you were born in 1957 or later. You may also need a second dose. Pneumococcal conjugate (PCV13) vaccine  You may need this if you have certain conditions and were not previously vaccinated. Pneumococcal polysaccharide (PPSV23) vaccine  You may need one or two doses if you smoke cigarettes or if you have certain conditions. Meningococcal conjugate (MenACWY) vaccine  You may need this if you have certain conditions. Hepatitis A vaccine  You may need this if you have certain conditions or if you travel or work in places where you may be exposed to hepatitis A. Hepatitis B vaccine  You may need this if you have certain conditions or if you travel or work in places where you may be exposed to hepatitis B. Haemophilus influenzae type b (Hib) vaccine  You may need this if you have certain risk factors. Human papillomavirus (HPV) vaccine  If recommended by your health care provider, you may need three doses over 6 months. You may receive vaccines as individual doses or as more than one vaccine together in one shot (combination vaccines). Talk with your health care provider about the risks and benefits of combination vaccines. What tests do I need? Blood tests  Lipid and cholesterol levels. These may be checked every 5 years, or more frequently if you are over 49 years old.  Hepatitis C test.  Hepatitis B test. Screening  Lung cancer screening. You may have this screening every year starting at age  55 if you have a 30-pack-year history of smoking and currently smoke or have quit within the past 15 years.  Prostate cancer screening. Recommendations will vary depending on your family history and other risks.  Colorectal cancer screening. All adults should have this screening starting at age 20 and continuing until age 4. Your health care provider may recommend screening at age 26 if you are at  increased risk. You will have tests every 1-10 years, depending on your results and the type of screening test.  Diabetes screening. This is done by checking your blood sugar (glucose) after you have not eaten for a while (fasting). You may have this done every 1-3 years.  Sexually transmitted disease (STD) testing. Follow these instructions at home: Eating and drinking  Eat a diet that includes fresh fruits and vegetables, whole grains, lean protein, and low-fat dairy products.  Take vitamin and mineral supplements as recommended by your health care provider.  Do not drink alcohol if your health care provider tells you not to drink.  If you drink alcohol: ? Limit how much you have to 0-2 drinks a day. ? Be aware of how much alcohol is in your drink. In the [place] one drink equals one 12 oz bottle of beer (355 mL), one 5 oz glass of wine (148 mL), or one 1 oz glass of hard liquor (44 mL). Lifestyle  Take daily care of your teeth and gums.  Stay active. Exercise for at least 30 minutes on 5 or more days each week.  Do not use any products that contain nicotine or tobacco, such as cigarettes, e-cigarettes, and chewing tobacco. If you need help quitting, ask your health care provider.  If you are sexually active, practice safe sex. Use a condom or other form of protection to prevent STIs (sexually transmitted infections).  Talk with your health care provider about taking a low-dose aspirin every day starting at age 65. What's next?  Go to your health care provider once a year for a well check visit.  Ask your health care provider how often you should have your eyes and teeth checked.  Stay up to date on all vaccines. This information is not intended to replace advice given to you by your health care provider. Make sure you discuss any questions you have with your health care provider. Document Revised: 01/15/2018 Document Reviewed: 01/15/2018 Elsevier Patient Education  2020  [employer].

## 2019-10-06 ENCOUNTER — Encounter: Payer: Self-pay | Admitting: Medical

## 2020-08-04 ENCOUNTER — Encounter: Payer: Managed Care, Other (non HMO) | Admitting: Medical
